# Patient Record
Sex: Female | Born: 1990 | Race: Black or African American | Hispanic: No | Marital: Single | Smoking: Current every day smoker
Health system: Southern US, Community
[De-identification: ages and names within clinical notes are randomized; demographics above are authoritative.]

## PROBLEM LIST (undated history)

## (undated) DIAGNOSIS — K2970 Gastritis, unspecified, without bleeding: Secondary | ICD-10-CM

## (undated) DIAGNOSIS — K219 Gastro-esophageal reflux disease without esophagitis: Secondary | ICD-10-CM

---

## 2015-07-01 ENCOUNTER — Emergency Department: Payer: Medicaid Other

## 2015-07-01 ENCOUNTER — Encounter: Payer: Self-pay | Admitting: Emergency Medicine

## 2015-07-01 ENCOUNTER — Emergency Department
Admission: EM | Admit: 2015-07-01 | Discharge: 2015-07-01 | Disposition: A | Discharge status: Home / Self Care | Payer: Medicaid Other | Attending: Emergency Medicine | Admitting: Emergency Medicine

## 2015-07-01 DIAGNOSIS — R112 Nausea with vomiting, unspecified: Secondary | ICD-10-CM | POA: Diagnosis not present

## 2015-07-01 DIAGNOSIS — R1013 Epigastric pain: Secondary | ICD-10-CM | POA: Insufficient documentation

## 2015-07-01 DIAGNOSIS — F17210 Nicotine dependence, cigarettes, uncomplicated: Secondary | ICD-10-CM | POA: Diagnosis not present

## 2015-07-01 DIAGNOSIS — R1011 Right upper quadrant pain: Secondary | ICD-10-CM | POA: Diagnosis present

## 2015-07-01 DIAGNOSIS — D72829 Elevated white blood cell count, unspecified: Secondary | ICD-10-CM

## 2015-07-01 DIAGNOSIS — K2970 Gastritis, unspecified, without bleeding: Secondary | ICD-10-CM

## 2015-07-01 HISTORY — DX: Gastro-esophageal reflux disease without esophagitis: K21.9

## 2015-07-01 LAB — COMPREHENSIVE METABOLIC PANEL
ALBUMIN: 4.5 g/dL (ref 3.5–5.0)
ALT: 29 U/L (ref 14–54)
AST: 37 U/L (ref 15–41)
Alkaline Phosphatase: 97 U/L (ref 38–126)
Anion gap: 12 (ref 5–15)
BUN: 16 mg/dL (ref 6–20)
CHLORIDE: 97 mmol/L — AB (ref 101–111)
CO2: 23 mmol/L (ref 22–32)
CREATININE: 1.02 mg/dL — AB (ref 0.44–1.00)
Calcium: 9 mg/dL (ref 8.9–10.3)
GFR calc Af Amer: >60 mL/min (ref >60)
GFR calc non Af Amer: >60 mL/min (ref >60)
Glucose, Bld: 101 mg/dL — ABNORMAL HIGH (ref 65–99)
Potassium: 3.4 mmol/L — ABNORMAL LOW (ref 3.5–5.1)
SODIUM: 132 mmol/L — AB (ref 135–145)
Total Bilirubin: 1.2 mg/dL (ref 0.3–1.2)
Total Protein: 9.3 g/dL — ABNORMAL HIGH (ref 6.5–8.1)

## 2015-07-01 LAB — CBC
HEMATOCRIT: 43.9 % (ref 35.0–47.0)
Hemoglobin: 14.9 g/dL (ref 12.0–16.0)
MCH: 32.6 pg (ref 26.0–34.0)
MCHC: 34 g/dL (ref 32.0–36.0)
MCV: 95.6 fL (ref 80.0–100.0)
Platelets: 340 10*3/uL (ref 150–440)
RBC: 4.59 MIL/uL (ref 3.80–5.20)
RDW: 13.3 % (ref 11.5–14.5)
WBC: 20.2 10*3/uL — ABNORMAL HIGH (ref 3.6–11.0)

## 2015-07-01 LAB — POCT PREGNANCY, URINE: PREG TEST UR: NEGATIVE

## 2015-07-01 LAB — LIPASE, BLOOD: LIPASE: 14 U/L (ref 11–51)

## 2015-07-01 LAB — URINALYSIS COMPLETE WITH MICROSCOPIC (ARMC ONLY)
Bilirubin Urine: NEGATIVE
Glucose, UA: NEGATIVE mg/dL
Leukocytes, UA: NEGATIVE
Nitrite: NEGATIVE
PROTEIN: 100 mg/dL — AB
Specific Gravity, Urine: 1.036 — ABNORMAL HIGH (ref 1.005–1.030)
pH: 6 (ref 5.0–8.0)

## 2015-07-01 MED ORDER — SODIUM CHLORIDE 0.9 % IV BOLUS (SEPSIS)
1000.0000 mL | Freq: Once | INTRAVENOUS | Status: AC
  Administered 2015-07-01: 1000 mL via INTRAVENOUS

## 2015-07-01 MED ORDER — ONDANSETRON HCL 4 MG/2ML IJ SOLN
4.0000 mg | Freq: Once | INTRAMUSCULAR | Status: AC
  Administered 2015-07-01: 4 mg via INTRAVENOUS
  Filled 2015-07-01: qty 2

## 2015-07-01 MED ORDER — MORPHINE SULFATE (PF) 4 MG/ML IV SOLN
4.0000 mg | Freq: Once | INTRAVENOUS | Status: AC
  Administered 2015-07-01: 4 mg via INTRAVENOUS
  Filled 2015-07-01: qty 1

## 2015-07-01 MED ORDER — SUCRALFATE 1 G PO TABS: 1.0000 g | ORAL_TABLET | Freq: Four times a day (QID) | ORAL | Status: DC | PRN

## 2015-07-01 MED ORDER — IOHEXOL 240 MG/ML SOLN
25.0000 mL | Freq: Once | INTRAMUSCULAR | Status: AC | PRN
  Administered 2015-07-01: 25 mL via ORAL
  Filled 2015-07-01: qty 25

## 2015-07-01 MED ORDER — GI COCKTAIL ~~LOC~~: 30.0000 mL | Freq: Once | ORAL | Status: DC

## 2015-07-01 MED ORDER — ONDANSETRON HCL 4 MG PO TABS: ORAL_TABLET | ORAL | Status: DC

## 2015-07-01 MED ORDER — IOHEXOL 300 MG/ML  SOLN
125.0000 mL | Freq: Once | INTRAMUSCULAR | Status: AC | PRN
  Administered 2015-07-01: 125 mL via INTRAVENOUS

## 2015-07-01 NOTE — ED Provider Notes (Signed)
[facility] Emergency Department Provider Note  ____________________________________________  Time seen: Approximately 8:33 PM  I have reviewed the triage vital signs and the nursing notes.   HISTORY  Chief Complaint Abdominal Pain    HPI Christine Shields is a 25 y.o. female with past medical history of acid reflux who presents with acute onset of nausea, vomiting, and severe epigastric pain for about 2 days.  She states this started abruptly 2 days ago and has been constant.  She states that it is a severe burning sensation in her epigastrium and she feels like eating makes it worse.  She presents very dramatically, writhing, moaning, groaning, and flipping around in bed.  During her interview she began screaming because she said that her jaw locked up and she was sobbing and crying freely.  She does endorse a history of acid reflux and states that it feels similar.  She denies fever/chills, chest pain, shortness of breath, and any other abdominal pain.  She did have one loose stool.  Nothing makes it better and she thinks that everything makes it worse.   History reviewed. No pertinent past medical history.  There are no active problems to display for this patient.   History reviewed. No pertinent past surgical history.  No current outpatient prescriptions on file.  Allergies Review of patient's allergies indicates no known allergies.  No family history on file.  Social History Social History  Substance Use Topics  . Smoking status: Current Every Day Smoker -- 0.50 packs/day    Types: Cigarettes  . Smokeless tobacco: None  . Alcohol Use: Yes    Review of Systems Constitutional: No fever/chills Eyes: No visual changes. ENT: No sore throat. Cardiovascular: Denies chest pain. Respiratory: Denies shortness of breath. Gastrointestinal: Severe burning epigastric pain with nausea and vomiting and one loose stool Genitourinary: Negative for  dysuria. Musculoskeletal: Negative for back pain. Skin: Negative for rash. Neurological: Negative for headaches, focal weakness or numbness.  10-point ROS otherwise negative.  ____________________________________________   PHYSICAL EXAM:  VITAL SIGNS: ED Triage Vitals  Enc Vitals Group     BP 07/01/15 1913 129/80 mmHg     Pulse Rate 07/01/15 1913 78     Resp 07/01/15 1913 20     Temp 07/01/15 1913 98.5 F (36.9 C)     Temp Source 07/01/15 1913 Oral     SpO2 07/01/15 1913 99 %     Weight 07/01/15 1913 190 lb (86.183 kg)     Height 07/01/15 1913 5\' 3"  (1.6 m)     Head Cir --      Peak Flow --      Pain Score 07/01/15 1914 8     Pain Loc --      Pain Edu? --      Excl. in GC? --     Constitutional: Alert, sobbing, moaning, sometimes yelling/screaming in pain, very dramatic upon arrival in exam room Eyes: Conjunctivae are normal. PERRL. EOMI. Head: Atraumatic. Nose: No congestion/rhinnorhea. Mouth/Throat: Mucous membranes are moist.  Oropharynx non-erythematous. Neck: No stridor.  No meningeal signs.   Cardiovascular: Normal rate, regular rhythm. Good peripheral circulation. Grossly normal heart sounds.   Respiratory: Normal respiratory effort.  No retractions. Lungs CTAB. Gastrointestinal: Soft w/ TTP of RUQ.  Difficult to assess due to lack of cooperation and dramatic presentation Musculoskeletal: No lower extremity tenderness nor edema. No gross deformities of extremities. Neurologic:  Normal speech and language. No gross focal neurologic deficits are appreciated.  Skin:  Skin is warm, dry and intact. No rash noted.   ____________________________________________   LABS (all labs ordered are listed, but only abnormal results are displayed)  Labs Reviewed  COMPREHENSIVE METABOLIC PANEL - Abnormal; Notable for the following:    Sodium 132 (*)    Potassium 3.4 (*)    Chloride 97 (*)    Glucose, Bld 101 (*)    Creatinine, Ser 1.02 (*)    Total Protein 9.3 (*)     All other components within normal limits  CBC - Abnormal; Notable for the following:    WBC 20.2 (*)    All other components within normal limits  LIPASE, BLOOD  URINALYSIS COMPLETEWITH MICROSCOPIC ([facility] ONLY)  POC URINE PREG, ED   ____________________________________________  EKG  None ____________________________________________  RADIOLOGY   No results found.  ____________________________________________   PROCEDURES  Procedure(s) performed: None  Critical Care performed: No ____________________________________________   INITIAL IMPRESSION / ASSESSMENT AND PLAN / ED COURSE  Pertinent labs & imaging results that were available during my care of the patient were reviewed by me and considered in my medical decision making (see chart for details).  Given her reported severe pain and the dramatic presentation, I immediately ordered 4 mg of morphine and 4 mg Zofran which alleviated her pain completely according to the patient's report to the nurse on reassessment.  She has a leukocytosis of 20 which is significant and may represent an intra-abdominal pathology.  Given the location of epigastric pain and the tenderness to palpation of the right upper quadrant abdominal pain and a right upper quadrant ultrasound to look for gallbladder disease.  He has no pelvic or lower abdominal tenderness.  If her ultrasound is negative, I will proceed with a CT scan of the abdomen and pelvis given her leukocytosis and reported pain.  ----------------------------------------- 11:01 PM on 07/01/2015 -----------------------------------------  The patient has no more pain at this time, the one dose of morphine seems completely resolved it.  She is still awaiting a CT scan.  Her urine demonstrated mild ketonuria but no gross evidence of infection.  I am transferring ED care to Dr. Dolores Frame to follow-up the pending CT scan and reassess.  ____________________________________________  FINAL CLINICAL  IMPRESSION(S) / ED DIAGNOSES  Epigastric abdominal pain Nausea and vomiting Leukocytosis    NEW MEDICATIONS STARTED DURING THIS VISIT:  New Prescriptions   No medications on file      Note:  This document was prepared using Dragon voice recognition software and may include unintentional dictation errors.   Loleta Rose, MD 07/02/15 1455

## 2015-07-01 NOTE — ED Notes (Signed)
Pt to triage via w/c with no distress noted, brought in by EMS; pt c/o stomach burning and nausea; st takes nexium for acid reflux daily but does not help

## 2015-07-01 NOTE — ED Notes (Addendum)
Pt presents to ED c/o N/V x 2 days, reports had diarrhea episode on 3/19. Pt reports has not had BM in 2 days, reports has not wanted to eat or drink because she states she is feels eating would make her pain worse. During assessment patient stating "I just want to sleep, it makes the acid go away," Pt moaning and groaning, reporting burning in mid abdomen area. Pt has hx of acid reflux. Pt alert and oriented, skin warm and dry. Denies chest pain or shortness of breath. Pt denies fever.

## 2015-07-01 NOTE — ED Provider Notes (Signed)
-----------------------------------------   11:38 PM on 07/01/2015 -----------------------------------------  CT abdomen and pelvis with contrast interpreted per Dr. Andria MeuseStevens: No acute process demonstrated in the abdomen or pelvis. No evidence of bowel obstruction or inflammation. There is thickening of the wall of the esophagus which may indicate reflux disease.  Patient is resting in no acute distress. Updated her of CT imaging results. Will proceed with plan to discharge home on Carafate and Zofran per Dr. York CeriseForbach. Strict return precautions given. Patient verbalizes understanding and agrees with plan of care.  Irean HongJade J Sung, MD 07/02/15 [phone]

## 2015-07-01 NOTE — Discharge Instructions (Signed)
You have been seen in the Emergency Department (ED) for abdominal pain.  Your evaluation did not identify a clear cause of your symptoms but was generally reassuring. ° °Please follow up as instructed above regarding today’s emergent visit and the symptoms that are bothering you. ° °Return to the ED if your abdominal pain worsens or fails to improve, you develop bloody vomiting, bloody diarrhea, you are unable to tolerate fluids due to vomiting, fever greater than 101, or other symptoms that concern you. ° ° °Gastritis, Adult °Gastritis is soreness and swelling (inflammation) of the lining of the stomach. Gastritis can develop as a sudden onset (acute) or long-term (chronic) condition. If gastritis is not treated, it can lead to stomach bleeding and ulcers. °CAUSES  °Gastritis occurs when the stomach lining is weak or damaged. Digestive juices from the stomach then inflame the weakened stomach lining. The stomach lining may be weak or damaged due to viral or bacterial infections. One common bacterial infection is the Helicobacter pylori infection. Gastritis can also result from excessive alcohol consumption, taking certain medicines, or having too much acid in the stomach.  °SYMPTOMS  °In some cases, there are no symptoms. When symptoms are present, they may include: °· Pain or a burning sensation in the upper abdomen. °· Nausea. °· Vomiting. °· An uncomfortable feeling of fullness after eating. °DIAGNOSIS  °Your caregiver may suspect you have gastritis based on your symptoms and a physical exam. To determine the cause of your gastritis, your caregiver may perform the following: °· Blood or stool tests to check for the H pylori bacterium. °· Gastroscopy. A thin, flexible tube (endoscope) is passed down the esophagus and into the stomach. The endoscope has a light and camera on the end. Your caregiver uses the endoscope to view the inside of the stomach. °· Taking a tissue sample (biopsy) from the stomach to examine  under a microscope. °TREATMENT  °Depending on the cause of your gastritis, medicines may be prescribed. If you have a bacterial infection, such as an H pylori infection, antibiotics may be given. If your gastritis is caused by too much acid in the stomach, H2 blockers or antacids may be given. Your caregiver may recommend that you stop taking aspirin, ibuprofen, or other nonsteroidal anti-inflammatory drugs (NSAIDs). °HOME CARE INSTRUCTIONS °· Only take over-the-counter or prescription medicines as directed by your caregiver. °· If you were given antibiotic medicines, take them as directed. Finish them even if you start to feel better. °· Drink enough fluids to keep your urine clear or pale yellow. °· Avoid foods and drinks that make your symptoms worse, such as: °· Caffeine or alcoholic drinks. °· Chocolate. °· Peppermint or mint flavorings. °· Garlic and onions. °· Spicy foods. °· Citrus fruits, such as oranges, lemons, or limes. °· Tomato-based foods such as sauce, chili, salsa, and pizza. °· Fried and fatty foods. °· Eat small, frequent meals instead of large meals. °SEEK IMMEDIATE MEDICAL CARE IF:  °· You have black or dark red stools. °· You vomit blood or material that looks like coffee grounds. °· You are unable to keep fluids down. °· Your abdominal pain gets worse. °· You have a fever. °· You do not feel better after 1 week. °· You have any other questions or concerns. °MAKE SURE YOU: °· Understand these instructions. °· Will watch your condition. °· Will get help right away if you are not doing well or get worse. °  °This information is not intended to replace advice given to   you by your health care provider. Make sure you discuss any questions you have with your health care provider.   Document Released: 03/22/2001 Document Revised: 09/27/2011 Document Reviewed: 05/11/2011 Elsevier Interactive Patient Education 2016 Elsevier Inc.    Abdominal Pain, Adult Many things can cause abdominal pain.  Usually, abdominal pain is not caused by a disease and will improve without treatment. It can often be observed and treated at home. Your health care provider will do a physical exam and possibly order blood tests and X-rays to help determine the seriousness of your pain. However, in many cases, more time must pass before a clear cause of the pain can be found. Before that point, your health care provider may not know if you need more testing or further treatment. HOME CARE INSTRUCTIONS Monitor your abdominal pain for any changes. The following actions may help to alleviate any discomfort you are experiencing:  Only take over-the-counter or prescription medicines as directed by your health care provider.  Do not take laxatives unless directed to do so by your health care provider.  Try a clear liquid diet (broth, tea, or water) as directed by your health care provider. Slowly move to a bland diet as tolerated. SEEK MEDICAL CARE IF:  You have unexplained abdominal pain.  You have abdominal pain associated with nausea or diarrhea.  You have pain when you urinate or have a bowel movement.  You experience abdominal pain that wakes you in the night.  You have abdominal pain that is worsened or improved by eating food.  You have abdominal pain that is worsened with eating fatty foods.  You have a fever. SEEK IMMEDIATE MEDICAL CARE IF:  Your pain does not go away within 2 hours.  You keep throwing up (vomiting).  Your pain is felt only in portions of the abdomen, such as the right side or the left lower portion of the abdomen.  You pass bloody or black tarry stools. MAKE SURE YOU:  Understand these instructions.  Will watch your condition.  Will get help right away if you are not doing well or get worse.   This information is not intended to replace advice given to you by your health care provider. Make sure you discuss any questions you have with your health care provider.     Document Released: 01/05/2005 Document Revised: 12/17/2014 Document Reviewed: 12/05/2012 Elsevier Interactive Patient Education [employer] Inc[employer]

## 2015-07-03 ENCOUNTER — Emergency Department
Admission: EM | Admit: 2015-07-03 | Discharge: 2015-07-03 | Disposition: A | Discharge status: Home / Self Care | Payer: Medicaid Other | Attending: Emergency Medicine | Admitting: Emergency Medicine

## 2015-07-03 ENCOUNTER — Encounter: Payer: Self-pay | Admitting: *Deleted

## 2015-07-03 DIAGNOSIS — R1013 Epigastric pain: Secondary | ICD-10-CM

## 2015-07-03 DIAGNOSIS — F17210 Nicotine dependence, cigarettes, uncomplicated: Secondary | ICD-10-CM | POA: Insufficient documentation

## 2015-07-03 DIAGNOSIS — K2970 Gastritis, unspecified, without bleeding: Secondary | ICD-10-CM | POA: Diagnosis not present

## 2015-07-03 DIAGNOSIS — R1010 Upper abdominal pain, unspecified: Secondary | ICD-10-CM | POA: Diagnosis present

## 2015-07-03 LAB — COMPREHENSIVE METABOLIC PANEL
ALT: 55 U/L — ABNORMAL HIGH (ref 14–54)
ANION GAP: 17 — AB (ref 5–15)
AST: 60 U/L — AB (ref 15–41)
Albumin: 4.4 g/dL (ref 3.5–5.0)
Alkaline Phosphatase: 93 U/L (ref 38–126)
BUN: 14 mg/dL (ref 6–20)
CHLORIDE: 87 mmol/L — AB (ref 101–111)
CO2: 28 mmol/L (ref 22–32)
Calcium: 9.3 mg/dL (ref 8.9–10.3)
Creatinine, Ser: 1.03 mg/dL — ABNORMAL HIGH (ref 0.44–1.00)
GFR calc Af Amer: >60 mL/min (ref >60)
Glucose, Bld: 86 mg/dL (ref 65–99)
POTASSIUM: 2.7 mmol/L — AB (ref 3.5–5.1)
Sodium: 132 mmol/L — ABNORMAL LOW (ref 135–145)
Total Bilirubin: 1.6 mg/dL — ABNORMAL HIGH (ref 0.3–1.2)
Total Protein: 8.7 g/dL — ABNORMAL HIGH (ref 6.5–8.1)

## 2015-07-03 LAB — CBC
HCT: 44.4 % (ref 35.0–47.0)
Hemoglobin: 15.4 g/dL (ref 12.0–16.0)
MCH: 32.5 pg (ref 26.0–34.0)
MCHC: 34.6 g/dL (ref 32.0–36.0)
MCV: 94 fL (ref 80.0–100.0)
Platelets: 360 10*3/uL (ref 150–440)
RBC: 4.72 MIL/uL (ref 3.80–5.20)
RDW: 13.2 % (ref 11.5–14.5)
WBC: 14.4 10*3/uL — ABNORMAL HIGH (ref 3.6–11.0)

## 2015-07-03 LAB — LIPASE, BLOOD: Lipase: 27 U/L (ref 11–51)

## 2015-07-03 MED ORDER — ONDANSETRON HCL 4 MG/2ML IJ SOLN
4.0000 mg | Freq: Once | INTRAMUSCULAR | Status: AC
  Administered 2015-07-03: 4 mg via INTRAVENOUS
  Filled 2015-07-03: qty 2

## 2015-07-03 MED ORDER — HYDROCODONE-ACETAMINOPHEN 5-325 MG PO TABS: 1.0000 | ORAL_TABLET | ORAL | Status: DC | PRN

## 2015-07-03 MED ORDER — SODIUM CHLORIDE 0.9 % IV BOLUS (SEPSIS)
1000.0000 mL | Freq: Once | INTRAVENOUS | Status: AC
  Administered 2015-07-03: 1000 mL via INTRAVENOUS

## 2015-07-03 MED ORDER — MORPHINE SULFATE (PF) 4 MG/ML IV SOLN
4.0000 mg | Freq: Once | INTRAVENOUS | Status: AC
  Administered 2015-07-03: 4 mg via INTRAVENOUS
  Filled 2015-07-03: qty 1

## 2015-07-03 MED ORDER — GI COCKTAIL ~~LOC~~
30.0000 mL | Freq: Once | ORAL | Status: AC
Start: 2015-07-03 — End: 2015-07-03
  Administered 2015-07-03: 30 mL via ORAL
  Filled 2015-07-03: qty 30

## 2015-07-03 MED ORDER — PANTOPRAZOLE SODIUM 40 MG PO TBEC: 40.0000 mg | DELAYED_RELEASE_TABLET | Freq: Every day | ORAL | Status: DC

## 2015-07-03 NOTE — Discharge Instructions (Signed)
Please take your medications as prescribed. Follow-up with GI medicine by calling the number provided. Return to the emergency department for any worsening pain, or any other symptom personally concerning to yourself.   Gastritis, Adult Gastritis is soreness and puffiness (inflammation) of the lining of the stomach. If you do not get help, gastritis can cause bleeding and sores (ulcers) in the stomach. HOME CARE   Only take medicine as told by your doctor.  If you were given antibiotic medicines, take them as told. Finish the medicines even if you start to feel better.  Drink enough fluids to keep your pee (urine) clear or pale yellow.  Avoid foods and drinks that make your problems worse. Foods you may want to avoid include:  Caffeine or alcohol.  Chocolate.  Mint.  Garlic and onions.  Spicy foods.  Citrus fruits, including oranges, lemons, or limes.  Food containing tomatoes, including sauce, chili, salsa, and pizza.  Fried and fatty foods.  Eat small meals throughout the day instead of large meals. GET HELP RIGHT AWAY IF:   You have black or dark red poop (stools).  You throw up (vomit) blood. It may look like coffee grounds.  You cannot keep fluids down.  Your belly (abdominal) pain gets worse.  You have a fever.  You do not feel better after 1 week.  You have any other questions or concerns. MAKE SURE YOU:   Understand these instructions.  Will watch your condition.  Will get help right away if you are not doing well or get worse.   This information is not intended to replace advice given to you by your health care provider. Make sure you discuss any questions you have with your health care provider.   Document Released: 09/14/2007 Document Revised: 06/20/2011 Document Reviewed: 05/11/2011 Elsevier Interactive Patient Education [employer] Else[employer] Inc[employer]

## 2015-07-03 NOTE — ED Provider Notes (Signed)
[facility] Emergency Department Provider Note  Time seen: 10:05 AM  I have reviewed the triage vital signs and the nursing notes.   HISTORY  Chief Complaint Abdominal Pain    HPI Christine Shields is a 25 y.o. female with a past medical history of gastric reflux who presents the emergency department with upper abdominal pain. According to the patient and her mother for the past 4 days of patient has had upper abdominal pain, nausea and vomiting. Was seen in the emergency department 2 days ago for the same diagnosed with gastritis. At that time had a normal CT scan. Mom states the patient has not sought vomiting since going home, unable to keep down any fluids or food. Patient describes a burning 10/10 pain in the epigastrium along with nausea and vomiting. Patient is actively dry heaving in the emergency department. Denies lower abdominal pain, dysuria, diarrhea. Denies chest pain or shortness of breath.Patient states a history of gastric reflux in the past, mom states she will occasionally be complaining of pain will take Tums or Zantac and feel better but she has never had an episode like this.     Past Medical History  Diagnosis Date  . GERD (gastroesophageal reflux disease)     There are no active problems to display for this patient.   History reviewed. No pertinent past surgical history.  Current Outpatient Rx  Name  Route  Sig  Dispense  Refill  . ondansetron (ZOFRAN) 4 MG tablet      Take 1-2 tabs by mouth every 8 hours as needed for nausea/vomiting   30 tablet   0   . sucralfate (CARAFATE) 1 g tablet   Oral   Take 1 tablet (1 g total) by mouth 4 (four) times daily as needed (for abdominal discomfort, nausea, and/or vomiting).   30 tablet   1     Allergies Review of patient's allergies indicates no known allergies.  No family history on file.  Social History Social History  Substance Use Topics  . Smoking status: Current Every Day  Smoker -- 0.50 packs/day    Types: Cigarettes  . Smokeless tobacco: None  . Alcohol Use: Yes    Review of Systems Constitutional: Negative for fever. Cardiovascular: Negative for chest pain. Respiratory: Negative for shortness of breath. Gastrointestinal: Epigastric burning pain. Positive for nausea and vomiting. Negative for diarrhea. Genitourinary: Negative for dysuria. Musculoskeletal: Negative for back pain. Neurological: Negative for headache 10-point ROS otherwise negative.  ____________________________________________   PHYSICAL EXAM:  VITAL SIGNS: ED Triage Vitals  Enc Vitals Group     BP 07/03/15 0906 121/73 mmHg     Pulse Rate 07/03/15 0906 113     Resp --      Temp 07/03/15 0906 98.8 F (37.1 C)     Temp Source 07/03/15 0906 Oral     SpO2 07/03/15 0906 98 %     Weight 07/03/15 0906 153 lb (69.4 kg)     Height 07/03/15 0906 5\' 3"  (1.6 m)     Head Cir --      Peak Flow --      Pain Score 07/03/15 0906 8     Pain Loc --      Pain Edu? --      Excl. in GC? --     Constitutional: Alert and oriented. Moderate distress due to epigastric pain nausea and vomiting. Moaning in bed. Eyes: Normal exam ENT   Head: Normocephalic and atraumatic.   Mouth/Throat:  Mucous membranes are moist. Cardiovascular: Normal rate, regular rhythm. No murmurs, rubs, or gallops. Respiratory: Normal respiratory effort without tachypnea nor retractions. Breath sounds are clear Gastrointestinal: Soft, moderate epigastric tenderness palpation. No rebound or guarding. No distention. Musculoskeletal: Nontender with normal range of motion in all extremities. Neurologic:  Normal speech and language. No gross focal neurologic deficits  Skin:  Skin is warm, dry and intact.  Psychiatric: Mood and affect are normal.     INITIAL IMPRESSION / ASSESSMENT AND PLAN / ED COURSE  Pertinent labs & imaging results that were available during my care of the patient were reviewed by me and  considered in my medical decision making (see chart for details).  Patient presents with upper abdominal pain, nausea, vomiting. Describes the pain as a burning sensation 10/10 in the epigastrium. Her exam is very consistent with gastritis. CT scan largely negative besides signs of chronic gastric reflux. Labs showed an elevated white blood cell count but were otherwise within normal limits. We will repeat labs, treat pain, nausea, IV hydrate, and closely monitor in the emergency department. Patient does have moderate epigastric tenderness, no rebound or guarding or signs of peritonitis.  Labs show a white blood cell count of 14, slightly improved from yesterday. Otherwise nonspecific findings of mild LFT elevation. Patient had a recent normal CT and ultrasound. Patient states she is feeling better, she was able to eat. States she feels some nausea but denies any pain. I offered admission given her continued nausea, patient much prefers to go home. We'll discharge him Protonix as well as a short course of pain medication. Patient is follow up with GI medicine. I discussed very strict return precautions with the patient.   ____________________________________________   FINAL CLINICAL IMPRESSION(S) / ED DIAGNOSES  Gastritis   Minna Antis, MD 07/03/15 [phone]

## 2015-07-03 NOTE — ED Notes (Signed)
Pt was seen 3/20 for gastritis, pt returns today for burning in her stomach with nausea and vomiting

## 2015-07-03 NOTE — ED Notes (Signed)
Pt's mother states N/V hasn't stopped since she was here 3/22. Pt can't keep fluids/food down.

## 2015-07-24 ENCOUNTER — Encounter: Payer: Self-pay | Admitting: Urgent Care

## 2015-07-24 ENCOUNTER — Emergency Department
Admission: EM | Admit: 2015-07-24 | Discharge: 2015-07-24 | Disposition: A | Discharge status: Home / Self Care | Payer: Medicaid Other | Attending: Emergency Medicine | Admitting: Emergency Medicine

## 2015-07-24 DIAGNOSIS — F17210 Nicotine dependence, cigarettes, uncomplicated: Secondary | ICD-10-CM | POA: Diagnosis not present

## 2015-07-24 DIAGNOSIS — R1013 Epigastric pain: Secondary | ICD-10-CM

## 2015-07-24 DIAGNOSIS — K219 Gastro-esophageal reflux disease without esophagitis: Secondary | ICD-10-CM | POA: Insufficient documentation

## 2015-07-24 DIAGNOSIS — K920 Hematemesis: Secondary | ICD-10-CM | POA: Diagnosis present

## 2015-07-24 DIAGNOSIS — R112 Nausea with vomiting, unspecified: Secondary | ICD-10-CM

## 2015-07-24 DIAGNOSIS — K2970 Gastritis, unspecified, without bleeding: Secondary | ICD-10-CM

## 2015-07-24 LAB — CBC
HCT: 44.6 % (ref 35.0–47.0)
Hemoglobin: 15.2 g/dL (ref 12.0–16.0)
MCH: 32.2 pg (ref 26.0–34.0)
MCHC: 34.2 g/dL (ref 32.0–36.0)
MCV: 94.2 fL (ref 80.0–100.0)
Platelets: 354 10*3/uL (ref 150–440)
RBC: 4.74 MIL/uL (ref 3.80–5.20)
RDW: 13.3 % (ref 11.5–14.5)
WBC: 16.5 10*3/uL — ABNORMAL HIGH (ref 3.6–11.0)

## 2015-07-24 LAB — COMPREHENSIVE METABOLIC PANEL
ALK PHOS: 96 U/L (ref 38–126)
ALT: 28 U/L (ref 14–54)
AST: 23 U/L (ref 15–41)
Albumin: 4.4 g/dL (ref 3.5–5.0)
Anion gap: 14 (ref 5–15)
BUN: 14 mg/dL (ref 6–20)
CALCIUM: 9.4 mg/dL (ref 8.9–10.3)
CO2: 26 mmol/L (ref 22–32)
CREATININE: 0.98 mg/dL (ref 0.44–1.00)
Chloride: 94 mmol/L — ABNORMAL LOW (ref 101–111)
GFR calc non Af Amer: >60 mL/min (ref >60)
GLUCOSE: 111 mg/dL — AB (ref 65–99)
Potassium: 2.9 mmol/L — CL (ref 3.5–5.1)
SODIUM: 134 mmol/L — AB (ref 135–145)
Total Bilirubin: 1.1 mg/dL (ref 0.3–1.2)
Total Protein: 9.5 g/dL — ABNORMAL HIGH (ref 6.5–8.1)

## 2015-07-24 LAB — TYPE AND SCREEN
ABO/RH(D): O POS
ANTIBODY SCREEN: NEGATIVE

## 2015-07-24 LAB — ABO/RH: ABO/RH(D): O POS

## 2015-07-24 MED ORDER — MORPHINE SULFATE (PF) 4 MG/ML IV SOLN
INTRAVENOUS | Status: AC
  Administered 2015-07-24: 4 mg via INTRAVENOUS
  Filled 2015-07-24: qty 1

## 2015-07-24 MED ORDER — ONDANSETRON HCL 4 MG/2ML IJ SOLN
4.0000 mg | Freq: Once | INTRAMUSCULAR | Status: AC
  Administered 2015-07-24: 4 mg via INTRAVENOUS

## 2015-07-24 MED ORDER — PROMETHAZINE HCL 25 MG PO TABS: 25.0000 mg | ORAL_TABLET | Freq: Four times a day (QID) | ORAL | Status: DC | PRN

## 2015-07-24 MED ORDER — ONDANSETRON HCL 4 MG/2ML IJ SOLN
INTRAMUSCULAR | Status: AC
  Administered 2015-07-24: 4 mg via INTRAVENOUS
  Filled 2015-07-24: qty 2

## 2015-07-24 MED ORDER — MORPHINE SULFATE (PF) 4 MG/ML IV SOLN
4.0000 mg | Freq: Once | INTRAVENOUS | Status: AC
  Administered 2015-07-24: 4 mg via INTRAVENOUS

## 2015-07-24 MED ORDER — SODIUM CHLORIDE 0.9 % IV BOLUS (SEPSIS)
1000.0000 mL | Freq: Once | INTRAVENOUS | Status: AC
  Administered 2015-07-24: 1000 mL via INTRAVENOUS

## 2015-07-24 NOTE — ED Provider Notes (Signed)
[facility] Emergency Department Provider Note  Time seen: 4:00 AM  I have reviewed the triage vital signs and the nursing notes.   HISTORY  Chief Complaint Hematemesis    HPI Christine Shields is a 25 y.o. female the past medical history of gastric reflux and gastritis who presents the emergency department up her abdominal pain and nausea and vomiting for the past 2 days.According to the patient she has a history of gastritis. She states intermittent epigastric pain for the past several weeks, was seen in the emergency department last month for similar event. She states Tuesday evening she had one beer shortly after she became nauseated with upper abdominal pain and vomiting. States she has had nausea and vomiting intermittently ever since. States initially on Tuesday evening after vomiting she had a brief syncopal versus near syncopal episode, but denies any since. Denies any chest pain or trouble breathing. Denies any lower abdominal pain, dysuria or diarrhea. Denies fever. Describes her upper abdominal pain as moderate, burning sensation to the epigastrium.     Past Medical History  Diagnosis Date  . GERD (gastroesophageal reflux disease)     There are no active problems to display for this patient.   History reviewed. No pertinent past surgical history.  Current Outpatient Rx  Name  Route  Sig  Dispense  Refill  . esomeprazole (NEXIUM) 40 MG capsule   Oral   Take 40 mg by mouth daily at 12 noon.         Marland Kitchen. HYDROcodone-acetaminophen (NORCO/VICODIN) 5-325 MG tablet   Oral   Take 1 tablet by mouth every 4 (four) hours as needed for moderate pain.   20 tablet   0   . ondansetron (ZOFRAN) 4 MG tablet      Take 1-2 tabs by mouth every 8 hours as needed for nausea/vomiting   30 tablet   0   . pantoprazole (PROTONIX) 40 MG tablet   Oral   Take 1 tablet (40 mg total) by mouth daily.   30 tablet   1   . sucralfate (CARAFATE) 1 g tablet   Oral    Take 1 tablet (1 g total) by mouth 4 (four) times daily as needed (for abdominal discomfort, nausea, and/or vomiting).   30 tablet   1     Allergies Review of patient's allergies indicates no known allergies.  No family history on file.  Social History Social History  Substance Use Topics  . Smoking status: Current Every Day Smoker -- 0.50 packs/day    Types: Cigarettes  . Smokeless tobacco: None  . Alcohol Use: Yes    Review of Systems Constitutional: Negative for fever. Cardiovascular: Negative for chest pain. Respiratory: Negative for shortness of breath. Gastrointestinal: Epigastric pain. Positive for nausea and vomiting. Negative diarrhea. Genitourinary: Negative for dysuria. Neurological: Negative for headache 10-point ROS otherwise negative.  ____________________________________________   PHYSICAL EXAM:  VITAL SIGNS: ED Triage Vitals  Enc Vitals Group     BP 07/24/15 0209 111/84 mmHg     Pulse Rate 07/24/15 0209 106     Resp 07/24/15 0209 24     Temp 07/24/15 0209 98.4 F (36.9 C)     Temp Source 07/24/15 0209 Oral     SpO2 07/24/15 0209 99 %     Weight 07/24/15 0209 153 lb (69.4 kg)     Height --      Head Cir --      Peak Flow --  Pain Score 07/24/15 0210 10     Pain Loc --      Pain Edu? --      Excl. in GC? --     Constitutional: Alert and oriented. Well appearing and in no distress. Eyes: Normal exam ENT   Head: Normocephalic and atraumatic   Mouth/Throat: Mucous membranes are moist. Cardiovascular: Normal rate, regular rhythm. No murmur Respiratory: Normal respiratory effort without tachypnea nor retractions. Breath sounds are clear Gastrointestinal: Soft, mild epigastric tenderness palpation. No rebound or guarding. No distention. Musculoskeletal: Nontender with normal range of motion in all extremities.  Neurologic:  Normal speech and language. No gross focal neurologic deficits  Skin:  Skin is warm, dry and intact.   Psychiatric: Mood and affect are normal.   ____________________________________________    INITIAL IMPRESSION / ASSESSMENT AND PLAN / ED COURSE  Pertinent labs & imaging results that were available during my care of the patient were reviewed by me and considered in my medical decision making (see chart for details).  Patient presents to the emergency department with epigastric pain/burning sensation along with nausea and vomiting since drinking alcohol 2 days ago. Patient has a history of gastritis with similar presentations in the past. Patient states the abdominal pain is much improved but she continues to feel nauseated. Patient had several episodes of blood streaking in her vomit tonight as well. We will treat the patient's pain and nausea, IV hydrate, and closely monitor in the emergency department. Patient's labs have resulted largely within normal limits, besides mild leukocytosis and hypokalemia, likely associated with the nausea and vomiting.  Patient feeling better after medications. We'll discharge home once IV bolus finishes.  ____________________________________________   FINAL CLINICAL IMPRESSION(S) / ED DIAGNOSES  Gastritis Nausea and vomiting   Minna Antis, MD 07/24/15 0501

## 2015-07-24 NOTE — ED Notes (Signed)
Patient presents with c/o hematemesis that began yesterday. Of note, patient was here on 3/22 for the same and Dx'd with gastritis. Patient reports that she went out drinking on Tuesday and "passed out" then.

## 2015-07-24 NOTE — Discharge Instructions (Signed)
Abdominal Pain, Adult Many things can cause belly (abdominal) pain. Most times, the belly pain is not dangerous. Many cases of belly pain can be watched and treated at home. HOME CARE   Do not take medicines that help you go poop (laxatives) unless told to by your doctor.  Only take medicine as told by your doctor.  Eat or drink as told by your doctor. Your doctor will tell you if you should be on a special diet. GET HELP IF:  You do not know what is causing your belly pain.  You have belly pain while you are sick to your stomach (nauseous) or have runny poop (diarrhea).  You have pain while you pee or poop.  Your belly pain wakes you up at night.  You have belly pain that gets worse or better when you eat.  You have belly pain that gets worse when you eat fatty foods.  You have a fever. GET HELP RIGHT AWAY IF:   The pain does not go away within 2 hours.  You keep throwing up (vomiting).  The pain changes and is only in the right or left part of the belly.  You have bloody or tarry looking poop. MAKE SURE YOU:   Understand these instructions.  Will watch your condition.  Will get help right away if you are not doing well or get worse.   This information is not intended to replace advice given to you by your health care provider. Make sure you discuss any questions you have with your health care provider.   Document Released: 09/14/2007 Document Revised: 04/18/2014 Document Reviewed: 12/05/2012 Elsevier Interactive Patient Education 2016 Elsevier Inc.  Gastritis, Adult Gastritis is soreness and swelling (inflammation) of the lining of the stomach. Gastritis can develop as a sudden onset (acute) or long-term (chronic) condition. If gastritis is not treated, it can lead to stomach bleeding and ulcers. CAUSES  Gastritis occurs when the stomach lining is weak or damaged. Digestive juices from the stomach then inflame the weakened stomach lining. The stomach lining may be  weak or damaged due to viral or bacterial infections. One common bacterial infection is the Helicobacter pylori infection. Gastritis can also result from excessive alcohol consumption, taking certain medicines, or having too much acid in the stomach.  SYMPTOMS  In some cases, there are no symptoms. When symptoms are present, they may include:  Pain or a burning sensation in the upper abdomen.  Nausea.  Vomiting.  An uncomfortable feeling of fullness after eating. DIAGNOSIS  Your caregiver may suspect you have gastritis based on your symptoms and a physical exam. To determine the cause of your gastritis, your caregiver may perform the following:  Blood or stool tests to check for the H pylori bacterium.  Gastroscopy. A thin, flexible tube (endoscope) is passed down the esophagus and into the stomach. The endoscope has a light and camera on the end. Your caregiver uses the endoscope to view the inside of the stomach.  Taking a tissue sample (biopsy) from the stomach to examine under a microscope. TREATMENT  Depending on the cause of your gastritis, medicines may be prescribed. If you have a bacterial infection, such as an H pylori infection, antibiotics may be given. If your gastritis is caused by too much acid in the stomach, H2 blockers or antacids may be given. Your caregiver may recommend that you stop taking aspirin, ibuprofen, or other nonsteroidal anti-inflammatory drugs (NSAIDs). HOME CARE INSTRUCTIONS  Only take over-the-counter or prescription medicines as directed  by your caregiver. °· If you were given antibiotic medicines, take them as directed. Finish them even if you start to feel better. °· Drink enough fluids to keep your urine clear or pale yellow. °· Avoid foods and drinks that make your symptoms worse, such as: °¨ Caffeine or alcoholic drinks. °¨ Chocolate. °¨ Peppermint or mint flavorings. °¨ Garlic and onions. °¨ Spicy foods. °¨ Citrus fruits, such as oranges, lemons, or  limes. °¨ Tomato-based foods such as sauce, chili, salsa, and pizza. °¨ Fried and fatty foods. °· Eat small, frequent meals instead of large meals. °SEEK IMMEDIATE MEDICAL CARE IF:  °· You have black or dark red stools. °· You vomit blood or material that looks like coffee grounds. °· You are unable to keep fluids down. °· Your abdominal pain gets worse. °· You have a fever. °· You do not feel better after 1 week. °· You have any other questions or concerns. °MAKE SURE YOU: °· Understand these instructions. °· Will watch your condition. °· Will get help right away if you are not doing well or get worse. °  °This information is not intended to replace advice given to you by your health care provider. Make sure you discuss any questions you have with your health care provider. °  °Document Released: 03/22/2001 Document Revised: 09/27/2011 Document Reviewed: 05/11/2011 °Elsevier Interactive Patient Education ©2016 Elsevier Inc. ° °

## 2015-07-28 ENCOUNTER — Emergency Department
Admission: EM | Admit: 2015-07-28 | Discharge: 2015-07-28 | Disposition: A | Discharge status: Home / Self Care | Payer: Medicaid Other | Attending: Emergency Medicine | Admitting: Emergency Medicine

## 2015-07-28 ENCOUNTER — Emergency Department: Payer: Medicaid Other

## 2015-07-28 DIAGNOSIS — K2970 Gastritis, unspecified, without bleeding: Secondary | ICD-10-CM | POA: Insufficient documentation

## 2015-07-28 DIAGNOSIS — F17210 Nicotine dependence, cigarettes, uncomplicated: Secondary | ICD-10-CM | POA: Diagnosis not present

## 2015-07-28 DIAGNOSIS — E876 Hypokalemia: Secondary | ICD-10-CM | POA: Diagnosis not present

## 2015-07-28 DIAGNOSIS — E871 Hypo-osmolality and hyponatremia: Secondary | ICD-10-CM | POA: Insufficient documentation

## 2015-07-28 DIAGNOSIS — R1110 Vomiting, unspecified: Secondary | ICD-10-CM | POA: Diagnosis present

## 2015-07-28 DIAGNOSIS — R112 Nausea with vomiting, unspecified: Secondary | ICD-10-CM

## 2015-07-28 LAB — COMPREHENSIVE METABOLIC PANEL
ALK PHOS: 78 U/L (ref 38–126)
ALT: 19 U/L (ref 14–54)
AST: 21 U/L (ref 15–41)
Albumin: 4.2 g/dL (ref 3.5–5.0)
Anion gap: 19 — ABNORMAL HIGH (ref 5–15)
BUN: 12 mg/dL (ref 6–20)
CALCIUM: 9.3 mg/dL (ref 8.9–10.3)
CO2: 27 mmol/L (ref 22–32)
CREATININE: 0.89 mg/dL (ref 0.44–1.00)
Chloride: 82 mmol/L — ABNORMAL LOW (ref 101–111)
GFR calc non Af Amer: >60 mL/min (ref >60)
Glucose, Bld: 85 mg/dL (ref 65–99)
Potassium: 2.5 mmol/L — CL (ref 3.5–5.1)
SODIUM: 128 mmol/L — AB (ref 135–145)
TOTAL PROTEIN: 8.7 g/dL — AB (ref 6.5–8.1)
Total Bilirubin: 1.4 mg/dL — ABNORMAL HIGH (ref 0.3–1.2)

## 2015-07-28 LAB — LIPASE, BLOOD: Lipase: 26 U/L (ref 11–51)

## 2015-07-28 LAB — BASIC METABOLIC PANEL
ANION GAP: 11 (ref 5–15)
BUN: 11 mg/dL (ref 6–20)
CALCIUM: 8 mg/dL — AB (ref 8.9–10.3)
CO2: 30 mmol/L (ref 22–32)
Chloride: 91 mmol/L — ABNORMAL LOW (ref 101–111)
Creatinine, Ser: 0.96 mg/dL (ref 0.44–1.00)
GFR calc Af Amer: >60 mL/min (ref >60)
GLUCOSE: 101 mg/dL — AB (ref 65–99)
Potassium: 2.8 mmol/L — CL (ref 3.5–5.1)
SODIUM: 132 mmol/L — AB (ref 135–145)

## 2015-07-28 LAB — CBC
HCT: 44.3 % (ref 35.0–47.0)
Hemoglobin: 15.3 g/dL (ref 12.0–16.0)
MCH: 32.4 pg (ref 26.0–34.0)
MCHC: 34.5 g/dL (ref 32.0–36.0)
MCV: 93.9 fL (ref 80.0–100.0)
PLATELETS: 326 10*3/uL (ref 150–440)
RBC: 4.72 MIL/uL (ref 3.80–5.20)
RDW: 13 % (ref 11.5–14.5)
WBC: 17.7 10*3/uL — ABNORMAL HIGH (ref 3.6–11.0)

## 2015-07-28 LAB — MAGNESIUM: MAGNESIUM: 2.2 mg/dL (ref 1.7–2.4)

## 2015-07-28 MED ORDER — SODIUM CHLORIDE 0.9 % IV BOLUS (SEPSIS)
1000.0000 mL | Freq: Once | INTRAVENOUS | Status: AC
Start: 2015-07-28 — End: 2015-07-28
  Administered 2015-07-28: 1000 mL via INTRAVENOUS

## 2015-07-28 MED ORDER — POTASSIUM CHLORIDE ER 10 MEQ PO TBCR: 10.0000 meq | EXTENDED_RELEASE_TABLET | Freq: Two times a day (BID) | ORAL | Status: DC

## 2015-07-28 MED ORDER — GI COCKTAIL ~~LOC~~
30.0000 mL | Freq: Once | ORAL | Status: AC
  Administered 2015-07-28: 30 mL via ORAL

## 2015-07-28 MED ORDER — POTASSIUM CHLORIDE CRYS ER 20 MEQ PO TBCR
40.0000 meq | EXTENDED_RELEASE_TABLET | Freq: Once | ORAL | Status: AC
  Administered 2015-07-28: 40 meq via ORAL
  Filled 2015-07-28: qty 2

## 2015-07-28 MED ORDER — GI COCKTAIL ~~LOC~~: 30.0000 mL | Freq: Two times a day (BID) | ORAL | Status: DC | PRN

## 2015-07-28 MED ORDER — POTASSIUM CHLORIDE IN NACL 20-0.9 MEQ/L-% IV SOLN
Freq: Once | INTRAVENOUS | Status: AC
  Administered 2015-07-28: 19:00:00 via INTRAVENOUS
  Filled 2015-07-28: qty 1000

## 2015-07-28 MED ORDER — GI COCKTAIL ~~LOC~~
ORAL | Status: AC
  Administered 2015-07-28: 30 mL via ORAL
  Filled 2015-07-28: qty 30

## 2015-07-28 NOTE — ED Notes (Signed)
Pt c/o epigastric pain for the past 8 days with N/V/D.Marland Kitchen.states she has been seen for the same sx recently

## 2015-07-28 NOTE — Discharge Instructions (Signed)
Please follow-up with GI medicine as scheduled in 2 days. Please use her medications as prescribed, as needed. Please drink plenty of fluids over the next several days. Return to the emergency department for any further significant vomiting episodes, increased abdominal pain, or any other symptoms concerning to yourself.   Gastritis, Adult Gastritis is soreness and swelling (inflammation) of the lining of the stomach. Gastritis can develop as a sudden onset (acute) or long-term (chronic) condition. If gastritis is not treated, it can lead to stomach bleeding and ulcers. CAUSES  Gastritis occurs when the stomach lining is weak or damaged. Digestive juices from the stomach then inflame the weakened stomach lining. The stomach lining may be weak or damaged due to viral or bacterial infections. One common bacterial infection is the Helicobacter pylori infection. Gastritis can also result from excessive alcohol consumption, taking certain medicines, or having too much acid in the stomach.  SYMPTOMS  In some cases, there are no symptoms. When symptoms are present, they may include:  Pain or a burning sensation in the upper abdomen.  Nausea.  Vomiting.  An uncomfortable feeling of fullness after eating. DIAGNOSIS  Your caregiver may suspect you have gastritis based on your symptoms and a physical exam. To determine the cause of your gastritis, your caregiver may perform the following:  Blood or stool tests to check for the H pylori bacterium.  Gastroscopy. A thin, flexible tube (endoscope) is passed down the esophagus and into the stomach. The endoscope has a light and camera on the end. Your caregiver uses the endoscope to view the inside of the stomach.  Taking a tissue sample (biopsy) from the stomach to examine under a microscope. TREATMENT  Depending on the cause of your gastritis, medicines may be prescribed. If you have a bacterial infection, such as an H pylori infection, antibiotics may  be given. If your gastritis is caused by too much acid in the stomach, H2 blockers or antacids may be given. Your caregiver may recommend that you stop taking aspirin, ibuprofen, or other nonsteroidal anti-inflammatory drugs (NSAIDs). HOME CARE INSTRUCTIONS  Only take over-the-counter or prescription medicines as directed by your caregiver.  If you were given antibiotic medicines, take them as directed. Finish them even if you start to feel better.  Drink enough fluids to keep your urine clear or pale yellow.  Avoid foods and drinks that make your symptoms worse, such as:  Caffeine or alcoholic drinks.  Chocolate.  Peppermint or mint flavorings.  Garlic and onions.  Spicy foods.  Citrus fruits, such as oranges, lemons, or limes.  Tomato-based foods such as sauce, chili, salsa, and pizza.  Fried and fatty foods.  Eat small, frequent meals instead of large meals. SEEK IMMEDIATE MEDICAL CARE IF:   You have black or dark red stools.  You vomit blood or material that looks like coffee grounds.  You are unable to keep fluids down.  Your abdominal pain gets worse.  You have a fever.  You do not feel better after 1 week.  You have any other questions or concerns. MAKE SURE YOU:  Understand these instructions.  Will watch your condition.  Will get help right away if you are not doing well or get worse.   This information is not intended to replace advice given to you by your health care provider. Make sure you discuss any questions you have with your health care provider.   Document Released: 03/22/2001 Document Revised: 09/27/2011 Document Reviewed: 05/11/2011 Elsevier Interactive Patient Education 2016 Elsevier  Inc.  Nausea and Vomiting Nausea means you feel sick to your stomach. Throwing up (vomiting) is a reflex where stomach contents come out of your mouth. HOME CARE   Take medicine as told by your doctor.  Do not force yourself to eat. However, you do need  to drink fluids.  If you feel like eating, eat a normal diet as told by your doctor.  Eat rice, wheat, potatoes, bread, lean meats, yogurt, fruits, and vegetables.  Avoid high-fat foods.  Drink enough fluids to keep your pee (urine) clear or pale yellow.  Ask your doctor how to replace body fluid losses (rehydrate). Signs of body fluid loss (dehydration) include:  Feeling very thirsty.  Dry lips and mouth.  Feeling dizzy.  Dark pee.  Peeing less than normal.  Feeling confused.  Fast breathing or heart rate. GET HELP RIGHT AWAY IF:   You have blood in your throw up.  You have black or bloody poop (stool).  You have a bad headache or stiff neck.  You feel confused.  You have bad belly (abdominal) pain.  You have chest pain or trouble breathing.  You do not pee at least once every 8 hours.  You have cold, clammy skin.  You keep throwing up after 24 to 48 hours.  You have a fever. MAKE SURE YOU:   Understand these instructions.  Will watch your condition.  Will get help right away if you are not doing well or get worse.   This information is not intended to replace advice given to you by your health care provider. Make sure you discuss any questions you have with your health care provider.   Document Released: 09/14/2007 Document Revised: 06/20/2011 Document Reviewed: 08/27/2010 Elsevier Interactive Patient Education [employer]2016 Elsevier Inc.

## 2015-07-28 NOTE — ED Provider Notes (Signed)
[facility] Emergency Department Provider Note  Time seen: 6:44 PM  I have reviewed the triage vital signs and the nursing notes.   HISTORY  Chief Complaint Emesis and Diarrhea    HPI Christine Shields is a 25 y.o. female with a past medical history of gastric reflux presents the emergency department nausea, vomiting, epigastric pain.According to the patient for the past 3 weeks she has been having significant nausea, vomiting, epigastric pain which she describes as burning. She states for the past one week she has not been able to tolerate liquids or food due to the abdominal discomfort nausea or vomiting. This is the patient's third visit in the past 3 weeks for the same symptom. Patient has not yet followed up with GI medicine. Patient states the vomiting was extremely severe this morning so she came to the emergency department once again for evaluation. Describes her epigastric pain currently as moderate 6/10, status post GI cocktail brought it down from a 10/10. Burning in quality.     Past Medical History  Diagnosis Date  . GERD (gastroesophageal reflux disease)     There are no active problems to display for this patient.   History reviewed. No pertinent past surgical history.  Current Outpatient Rx  Name  Route  Sig  Dispense  Refill  . esomeprazole (NEXIUM) 40 MG capsule   Oral   Take 40 mg by mouth daily at 12 noon.         Marland Kitchen HYDROcodone-acetaminophen (NORCO/VICODIN) 5-325 MG tablet   Oral   Take 1 tablet by mouth every 4 (four) hours as needed for moderate pain.   20 tablet   0   . ondansetron (ZOFRAN) 4 MG tablet      Take 1-2 tabs by mouth every 8 hours as needed for nausea/vomiting   30 tablet   0   . pantoprazole (PROTONIX) 40 MG tablet   Oral   Take 1 tablet (40 mg total) by mouth daily.   30 tablet   1   . promethazine (PHENERGAN) 25 MG tablet   Oral   Take 1 tablet (25 mg total) by mouth every 6 (six) hours as needed for  nausea or vomiting.   20 tablet   0   . sucralfate (CARAFATE) 1 g tablet   Oral   Take 1 tablet (1 g total) by mouth 4 (four) times daily as needed (for abdominal discomfort, nausea, and/or vomiting).   30 tablet   1     Allergies Review of patient's allergies indicates no known allergies.  No family history on file.  Social History Social History  Substance Use Topics  . Smoking status: Current Every Day Smoker -- 0.50 packs/day    Types: Cigarettes  . Smokeless tobacco: None  . Alcohol Use: Yes    Review of Systems Constitutional: Negative for fever. Cardiovascular: Negative for chest pain. Respiratory: Negative for shortness of breath. Gastrointestinal: Moderate epigastric burning. Positive for nausea and vomiting. Negative diarrhea. Musculoskeletal: Negative for back pain. Skin: Negative for rash. Neurological: Negative for headache 10-point ROS otherwise negative.  ____________________________________________   PHYSICAL EXAM:  VITAL SIGNS: ED Triage Vitals  Enc Vitals Group     BP 07/28/15 1630 133/80 mmHg     Pulse Rate 07/28/15 1630 97     Resp 07/28/15 1630 20     Temp 07/28/15 1632 97.6 F (36.4 C)     Temp Source 07/28/15 1630 Oral     SpO2 07/28/15 1630  100 %     Weight 07/28/15 1630 180 lb (81.647 kg)     Height 07/28/15 1630 5\' 4"  (1.626 m)     Head Cir --      Peak Flow --      Pain Score --      Pain Loc --      Pain Edu? --      Excl. in GC? --     Constitutional: Alert and oriented. Well appearing and in no distress. Eyes: Normal exam ENT   Head: Normocephalic and atraumatic.   Mouth/Throat: Mucous membranes are moist. Cardiovascular: Normal rate, regular rhythm. No murmur. Respiratory: Normal respiratory effort without tachypnea nor retractions. Breath sounds are clear  Gastrointestinal: Soft, mild epigastric tenderness palpation. No rebound or guarding. No distention Musculoskeletal: Nontender with normal range of motion in  all extremities.  Neurologic:  Normal speech and language. No gross focal neurologic deficits Skin:  Skin is warm, dry and intact.  Psychiatric: Mood and affect are normal. Speech and behavior are normal.   ____________________________________________    EKG  EKG reviewed and interpreted by myself shows sinus tachycardia 101 bpm, narrow QRS, normal axis, normal intervals. Nonspecific ST changes. No ST elevations.  ____________________________________________    RADIOLOGY  CT head shows no acute abnormality  ____________________________________________    INITIAL IMPRESSION / ASSESSMENT AND PLAN / ED COURSE  Pertinent labs & imaging results that were available during my care of the patient were reviewed by me and considered in my medical decision making (see chart for details).  Patient presents the emergency department with continued epigastric pain, nausea and vomiting. Patient's symptoms are very suggestive of gastritis. Patient is hyponatremic, hypokalemic, with a significant leukocytosis of 17,000. Her potassium continues to trend down over her 3 visits, sodium trends down over her 3 visits, and her white count trends up over the last 3 visits. Patient has an anion gap of 19 consistent with significant dehydration. We will IV hydrate, replete potassium, and closely monitor in the emergency department.  Patient states 1 week ago she had a syncopal episode in which she hit the left side of her head, since she continues to have headaches intermittently, states a mild headache currently. We'll proceed with a CT scan to rule out intracranial abnormalities.  Patient states she is feeling much better. She was able to eat in the emergency department a meal tray. Patient has received 1-1/2 L of fluid, as well as potassium supplementation. Patient's potassium is increased at 2.8, her sodium is increased to 132 which is much closer to the patient's baseline. Patient's anion gap is decreased  from 19-11. Patient has a follow-up appointment with Dr. Lynnae Prudeobert Elliott on 07/30/15. I discussed these results with the patient, the patient does wish to go home. We'll discharge the patient home, which GI follow-up in 2 days. Patient is agreeable discussed strict return precautions with the patient. We will place the patient on potassium supplementation. ____________________________________________   FINAL CLINICAL IMPRESSION(S) / ED DIAGNOSES  Gastritis Nausea and vomiting Hypokalemia Hyponatremia   Minna AntisKevin Harper Vandervoort, MD 07/28/15 2242

## 2015-07-31 ENCOUNTER — Encounter: Payer: Self-pay | Admitting: *Deleted

## 2015-07-31 ENCOUNTER — Encounter
Admission: RE | Disposition: A | Discharge status: Home / Self Care | Payer: Self-pay | Source: Ambulatory Visit | Attending: Gastroenterology

## 2015-07-31 ENCOUNTER — Ambulatory Visit: Payer: Medicaid Other | Admitting: Anesthesiology

## 2015-07-31 ENCOUNTER — Ambulatory Visit
Admission: RE | Admit: 2015-07-31 | Discharge: 2015-07-31 | Disposition: A | Discharge status: Home / Self Care | Payer: Medicaid Other | Source: Ambulatory Visit | Attending: Gastroenterology | Admitting: Gastroenterology

## 2015-07-31 ENCOUNTER — Ambulatory Visit: Admit: 2015-07-31 | Payer: Self-pay | Admitting: Gastroenterology

## 2015-07-31 DIAGNOSIS — K2210 Ulcer of esophagus without bleeding: Secondary | ICD-10-CM | POA: Diagnosis not present

## 2015-07-31 DIAGNOSIS — K209 Esophagitis, unspecified: Secondary | ICD-10-CM | POA: Diagnosis not present

## 2015-07-31 DIAGNOSIS — K210 Gastro-esophageal reflux disease with esophagitis: Secondary | ICD-10-CM | POA: Insufficient documentation

## 2015-07-31 DIAGNOSIS — K449 Diaphragmatic hernia without obstruction or gangrene: Secondary | ICD-10-CM | POA: Insufficient documentation

## 2015-07-31 DIAGNOSIS — R1310 Dysphagia, unspecified: Secondary | ICD-10-CM | POA: Diagnosis not present

## 2015-07-31 DIAGNOSIS — F17200 Nicotine dependence, unspecified, uncomplicated: Secondary | ICD-10-CM | POA: Diagnosis not present

## 2015-07-31 DIAGNOSIS — R1013 Epigastric pain: Secondary | ICD-10-CM | POA: Insufficient documentation

## 2015-07-31 DIAGNOSIS — K920 Hematemesis: Secondary | ICD-10-CM | POA: Insufficient documentation

## 2015-07-31 HISTORY — PX: ESOPHAGOGASTRODUODENOSCOPY (EGD) WITH PROPOFOL: SHX5813

## 2015-07-31 LAB — URINE DRUG SCREEN, QUALITATIVE (ARMC ONLY)
Amphetamines, Ur Screen: NOT DETECTED
BENZODIAZEPINE, UR SCRN: NOT DETECTED
Barbiturates, Ur Screen: POSITIVE — AB
CANNABINOID 50 NG, UR ~~LOC~~: POSITIVE — AB
Cocaine Metabolite,Ur ~~LOC~~: NOT DETECTED
MDMA (Ecstasy)Ur Screen: NOT DETECTED
Methadone Scn, Ur: NOT DETECTED
Opiate, Ur Screen: NOT DETECTED
PHENCYCLIDINE (PCP) UR S: NOT DETECTED
Tricyclic, Ur Screen: NOT DETECTED

## 2015-07-31 LAB — ELECTROLYTE PANEL
Anion gap: 12 (ref 5–15)
CO2: 30 mmol/L (ref 22–32)
Chloride: 89 mmol/L — ABNORMAL LOW (ref 101–111)
POTASSIUM: 2.8 mmol/L — AB (ref 3.5–5.1)
SODIUM: 131 mmol/L — AB (ref 135–145)

## 2015-07-31 SURGERY — ESOPHAGOGASTRODUODENOSCOPY (EGD) WITH PROPOFOL
Anesthesia: General

## 2015-07-31 MED ORDER — FENTANYL CITRATE (PF) 100 MCG/2ML IJ SOLN: 25.0000 ug | INTRAMUSCULAR | Status: DC | PRN

## 2015-07-31 MED ORDER — LIDOCAINE HCL (CARDIAC) 20 MG/ML IV SOLN
INTRAVENOUS | Status: DC | PRN
  Administered 2015-07-31: 40 mg via INTRAVENOUS

## 2015-07-31 MED ORDER — PROPOFOL 500 MG/50ML IV EMUL
INTRAVENOUS | Status: DC | PRN
  Administered 2015-07-31: 180 ug/kg/min via INTRAVENOUS

## 2015-07-31 MED ORDER — PROPOFOL 10 MG/ML IV BOLUS
INTRAVENOUS | Status: DC | PRN
  Administered 2015-07-31: 50 mg via INTRAVENOUS

## 2015-07-31 MED ORDER — ONDANSETRON HCL 4 MG/2ML IJ SOLN: 4.0000 mg | Freq: Once | INTRAMUSCULAR | Status: DC | PRN

## 2015-07-31 MED ORDER — FENTANYL CITRATE (PF) 100 MCG/2ML IJ SOLN
INTRAMUSCULAR | Status: DC | PRN
  Administered 2015-07-31: 50 ug via INTRAVENOUS

## 2015-07-31 MED ORDER — SODIUM CHLORIDE 0.9 % IV SOLN
INTRAVENOUS | Status: DC
  Administered 2015-07-31: 1000 mL via INTRAVENOUS

## 2015-07-31 MED ORDER — MIDAZOLAM HCL 2 MG/2ML IJ SOLN
INTRAMUSCULAR | Status: DC | PRN
  Administered 2015-07-31: 1 mg via INTRAVENOUS

## 2015-07-31 MED ORDER — SODIUM CHLORIDE 0.9 % IV SOLN
INTRAVENOUS | Status: DC
  Administered 2015-07-31: 09:00:00 via INTRAVENOUS

## 2015-07-31 NOTE — Anesthesia Preprocedure Evaluation (Signed)
Anesthesia Evaluation  Patient identified by MRN, date of birth, ID band Patient awake    Reviewed: Allergy & Precautions, NPO status , Patient's Chart, lab work & pertinent test results  Airway Mallampati: II       Dental no notable dental hx.    Pulmonary Current Smoker,    Pulmonary exam normal breath sounds clear to auscultation       Cardiovascular negative cardio ROS Normal cardiovascular exam     Neuro/Psych negative neurological ROS  negative psych ROS   GI/Hepatic GERD  Medicated,  Endo/Other  negative endocrine ROS  Renal/GU negative Renal ROS  negative genitourinary   Musculoskeletal negative musculoskeletal ROS (+)   Abdominal Normal abdominal exam  (+)   Peds negative pediatric ROS (+)  Hematology negative hematology ROS (+)   Anesthesia Other Findings   Reproductive/Obstetrics                             Anesthesia Physical Anesthesia Plan  ASA: II  Anesthesia Plan: General   Post-op Pain Management:    Induction: Intravenous  Airway Management Planned: Nasal Cannula  Additional Equipment:   Intra-op Plan:   Post-operative Plan:   Informed Consent: I have reviewed the patients History and Physical, chart, labs and discussed the procedure including the risks, benefits and alternatives for the proposed anesthesia with the patient or authorized representative who has indicated his/her understanding and acceptance.   Dental advisory given  Plan Discussed with: CRNA and Surgeon  Anesthesia Plan Comments:         Anesthesia Quick Evaluation

## 2015-07-31 NOTE — Anesthesia Procedure Notes (Signed)
Date/Time: 07/31/2015 9:08 AM Performed by: Stormy FabianURTIS, Katalyn Matin Pre-anesthesia Checklist: Patient identified, Emergency Drugs available, Suction available and Patient being monitored Patient Re-evaluated:Patient Re-evaluated prior to inductionOxygen Delivery Method: Nasal cannula Intubation Type: IV induction Dental Injury: Teeth and Oropharynx as per pre-operative assessment  Comments: Nasal cannula with etCO2 monitoring

## 2015-07-31 NOTE — Op Note (Signed)
[facility] Gastroenterology Patient Name: Christine Shields Procedure Date: 07/31/2015 9:09 AM MRN: [id number] Account #: [id number] Date of Birth: 10/28/1990 Admit Type: Outpatient Age: [age] Room: [facility] ROOM 4 Gender: Female Note Status: Finalized Procedure:            Upper GI endoscopy Indications:          Epigastric abdominal pain, Hematemesis Providers:            Ezzard StandingPaul Y. Bluford Kaufmannh, MD Medicines:            Monitored Anesthesia Care Complications:        No immediate complications. Procedure:            Pre-Anesthesia Assessment:                       - Prior to the procedure, a History and Physical was                        performed, and patient medications, allergies and                        sensitivities were reviewed. The patient's tolerance of                        previous anesthesia was reviewed.                       - The risks and benefits of the procedure and the                        sedation options and risks were discussed with the                        patient. All questions were answered and informed                        consent was obtained.                       - After reviewing the risks and benefits, the patient                        was deemed in satisfactory condition to undergo the                        procedure.                       After obtaining informed consent, the endoscope was                        passed under direct vision. Throughout the procedure,                        the patient's blood pressure, pulse, and oxygen                        saturations were monitored continuously. The Endoscope                        was introduced through the mouth, and advanced to the  second part of duodenum. The upper GI endoscopy was                        accomplished without difficulty. The patient tolerated                        the procedure well. Findings:      LA Grade C (one or more mucosal breaks  continuous between tops of 2 or       more mucosal folds, less than 75% circumference) esophagitis with no       bleeding was found in the lower third of the esophagus. Biopsies were       taken with a cold forceps for histology.      The exam was otherwise without abnormality.      A small hiatal hernia was present.      Localized moderately erythematous mucosa was found in the gastric       fundus. Biopsies were taken with a cold forceps for histology. Biopsies       were taken with a cold forceps for histology.      The exam was otherwise without abnormality.      The examined duodenum was normal. Impression:           - LA Grade C reflux esophagitis. Biopsied.                       - The examination was otherwise normal.                       - Small hiatal hernia.                       - Erythematous mucosa in the gastric fundus. Biopsied.                       - The examination was otherwise normal.                       - Normal examined duodenum. Recommendation:       - Discharge patient to home.                       - Observe patient's clinical course.                       - Await pathology results.                       - The findings and recommendations were discussed with                        the patient.                       - PPI BID x 1 month and then QD afterwards. Procedure Code(s):    --- Professional ---                       [phone], Esophagogastroduodenoscopy, flexible, transoral;                        with biopsy, single or multiple Diagnosis Code(s):    --- Professional ---  K21.0, Gastro-esophageal reflux disease with esophagitis                       K44.9, Diaphragmatic hernia without obstruction or                        gangrene                       K31.89, Other diseases of stomach and duodenum                       R10.13, Epigastric pain                       K92.0, Hematemesis CPT copyright 2016 [employer].  All rights reserved. The codes documented in this report are preliminary and upon coder review may  be revised to meet current compliance requirements. Wallace Cullens, MD 07/31/2015 9:19:58 AM This report has been signed electronically. Number of Addenda: 0 Note Initiated On: 07/31/2015 9:09 AM      [facility]

## 2015-07-31 NOTE — H&P (Signed)
  Date of Initial H&P: 07/30/2015  History reviewed, patient examined, no change in status, stable for surgery. 

## 2015-07-31 NOTE — Anesthesia Postprocedure Evaluation (Signed)
Anesthesia Post Note  Patient: Christine Shields  Procedure(s) Performed: Procedure(s) (LRB): ESOPHAGOGASTRODUODENOSCOPY (EGD) WITH PROPOFOL (N/A)  Patient location during evaluation: PACU Anesthesia Type: General Level of consciousness: awake and alert and oriented Pain management: pain level controlled Vital Signs Assessment: post-procedure vital signs reviewed and stable Respiratory status: spontaneous breathing Cardiovascular status: blood pressure returned to baseline Anesthetic complications: no    Last Vitals:  Filed Vitals:   07/31/15 0947 07/31/15 0957  BP: 124/81 125/74  Pulse: 84 76  Temp:    Resp: 23 23    Last Pain: There were no vitals filed for this visit.               Ledonna Dormer

## 2015-07-31 NOTE — Transfer of Care (Signed)
Immediate Anesthesia Transfer of Care Note  Patient: Christine Shields  Procedure(s) Performed: Procedure(s): ESOPHAGOGASTRODUODENOSCOPY (EGD) WITH PROPOFOL (N/A)  Patient Location: PACU and Endoscopy Unit  Anesthesia Type:General  Level of Consciousness: sedated  Airway & Oxygen Therapy: Patient Spontanous Breathing and Patient connected to nasal cannula oxygen  Post-op Assessment: Report given to RN and Post -op Vital signs reviewed and stable  Post vital signs: Reviewed and stable  Last Vitals:  Filed Vitals:   07/31/15 0824 07/31/15 0927  BP: 133/77 122/88  Pulse: 88 94  Temp: 37.3 C 36.1 C  Resp: 18 23    Complications: No apparent anesthesia complications

## 2015-08-03 LAB — SURGICAL PATHOLOGY

## 2015-08-05 ENCOUNTER — Encounter: Payer: Self-pay | Admitting: Gastroenterology

## 2015-10-23 ENCOUNTER — Emergency Department
Admission: EM | Admit: 2015-10-23 | Discharge: 2015-10-24 | Disposition: A | Discharge status: Home / Self Care | Payer: Medicaid Other | Attending: Emergency Medicine | Admitting: Emergency Medicine

## 2015-10-23 ENCOUNTER — Encounter: Payer: Self-pay | Admitting: *Deleted

## 2015-10-23 DIAGNOSIS — F1210 Cannabis abuse, uncomplicated: Secondary | ICD-10-CM | POA: Diagnosis not present

## 2015-10-23 DIAGNOSIS — R1084 Generalized abdominal pain: Secondary | ICD-10-CM | POA: Diagnosis present

## 2015-10-23 DIAGNOSIS — K529 Noninfective gastroenteritis and colitis, unspecified: Secondary | ICD-10-CM | POA: Insufficient documentation

## 2015-10-23 DIAGNOSIS — F17210 Nicotine dependence, cigarettes, uncomplicated: Secondary | ICD-10-CM | POA: Diagnosis not present

## 2015-10-23 DIAGNOSIS — Z5181 Encounter for therapeutic drug level monitoring: Secondary | ICD-10-CM | POA: Diagnosis not present

## 2015-10-23 LAB — URINALYSIS COMPLETE WITH MICROSCOPIC (ARMC ONLY)
BACTERIA UA: NONE SEEN
Bilirubin Urine: NEGATIVE
GLUCOSE, UA: NEGATIVE mg/dL
Nitrite: NEGATIVE
PH: 7 (ref 5.0–8.0)
PROTEIN: 100 mg/dL — AB
Specific Gravity, Urine: 1.029 (ref 1.005–1.030)

## 2015-10-23 LAB — COMPREHENSIVE METABOLIC PANEL
ALBUMIN: 4.2 g/dL (ref 3.5–5.0)
ALT: 17 U/L (ref 14–54)
AST: 23 U/L (ref 15–41)
Alkaline Phosphatase: 76 U/L (ref 38–126)
Anion gap: 9 (ref 5–15)
BUN: 7 mg/dL (ref 6–20)
CHLORIDE: 110 mmol/L (ref 101–111)
CO2: 20 mmol/L — ABNORMAL LOW (ref 22–32)
Calcium: 9.3 mg/dL (ref 8.9–10.3)
Creatinine, Ser: 0.88 mg/dL (ref 0.44–1.00)
GFR calc Af Amer: >60 mL/min (ref >60)
Glucose, Bld: 115 mg/dL — ABNORMAL HIGH (ref 65–99)
POTASSIUM: 3.7 mmol/L (ref 3.5–5.1)
Sodium: 139 mmol/L (ref 135–145)
Total Bilirubin: 0.5 mg/dL (ref 0.3–1.2)
Total Protein: 8 g/dL (ref 6.5–8.1)

## 2015-10-23 LAB — ETHANOL

## 2015-10-23 LAB — CBC
HEMATOCRIT: 42.5 % (ref 35.0–47.0)
HEMOGLOBIN: 14.8 g/dL (ref 12.0–16.0)
MCH: 32.8 pg (ref 26.0–34.0)
MCHC: 34.7 g/dL (ref 32.0–36.0)
MCV: 94.5 fL (ref 80.0–100.0)
Platelets: 302 10*3/uL (ref 150–440)
RBC: 4.5 MIL/uL (ref 3.80–5.20)
RDW: 14 % (ref 11.5–14.5)
WBC: 16.1 10*3/uL — AB (ref 3.6–11.0)

## 2015-10-23 LAB — LIPASE, BLOOD: LIPASE: 22 U/L (ref 11–51)

## 2015-10-23 LAB — POCT PREGNANCY, URINE: PREG TEST UR: NEGATIVE

## 2015-10-23 MED ORDER — SODIUM CHLORIDE 0.9 % IV BOLUS (SEPSIS)
500.0000 mL | Freq: Once | INTRAVENOUS | Status: AC
  Administered 2015-10-23: 500 mL via INTRAVENOUS

## 2015-10-23 MED ORDER — HALOPERIDOL LACTATE 5 MG/ML IJ SOLN
5.0000 mg | Freq: Once | INTRAMUSCULAR | Status: AC
  Administered 2015-10-23: 5 mg via INTRAVENOUS
  Filled 2015-10-23: qty 1

## 2015-10-23 MED ORDER — FENTANYL CITRATE (PF) 100 MCG/2ML IJ SOLN
50.0000 ug | INTRAMUSCULAR | Status: DC | PRN
  Administered 2015-10-23: 50 ug via NASAL
  Filled 2015-10-23: qty 2

## 2015-10-23 MED ORDER — FAMOTIDINE IN NACL 20-0.9 MG/50ML-% IV SOLN
20.0000 mg | Freq: Once | INTRAVENOUS | Status: AC
  Administered 2015-10-23: 20 mg via INTRAVENOUS
  Filled 2015-10-23: qty 50

## 2015-10-23 MED ORDER — ONDANSETRON 4 MG PO TBDP
ORAL_TABLET | ORAL | Status: DC
Start: 2015-10-23 — End: 2015-10-24
  Filled 2015-10-23: qty 1

## 2015-10-23 MED ORDER — ONDANSETRON 4 MG PO TBDP
4.0000 mg | ORAL_TABLET | Freq: Once | ORAL | Status: AC | PRN
  Administered 2015-10-23: 4 mg via ORAL

## 2015-10-23 NOTE — ED Notes (Signed)
Pt observed sticking finger into mouth to try to force herself to vomit. Pt observed refusing to keep oral zofran in her mouth and spitting it into the toilet. Pt is crying uncontrollably in triage when interacting w/ caregivers. Pt stated it was on her tongue and was told it was supposed to be on her tongue.

## 2015-10-23 NOTE — ED Notes (Signed)
Pt presents w/ R sided abdominal cramping. Pt states she has monthly uncontrolled pain related to her period and has experienced this for past 4 yrs.

## 2015-10-23 NOTE — ED Provider Notes (Signed)
[facility] Emergency Department Provider Note   ____________________________________________  Time seen: Approximately 11:22 PM  I have reviewed the triage vital signs and the nursing notes.   HISTORY  Chief Complaint Abdominal Cramping  Limited by odd affect  HPI Christine Shields is a 25 y.o. female who presents to the ED from home with a chief complaint of abdominal cramping. Patient reports she has monthly uncontrolled pain and related to her period; has experienced this for the past 4 years. Patient also has a history of alcoholic gastritis; endoscopy in April 2017 demonstrated hiatal hernia, esophagitis and gastritis.Complains of a 2 day history of "cramps". Denies associated fever, chills, chest pain, shortness of breath, nausea, vomiting, diarrhea. Denies recent travel or trauma. Nothing makes her symptoms better or worse.    Past Medical History  Diagnosis Date  . GERD (gastroesophageal reflux disease)     There are no active problems to display for this patient.   Past Surgical History  Procedure Laterality Date  . Esophagogastroduodenoscopy (egd) with propofol N/A 07/31/2015    Procedure: ESOPHAGOGASTRODUODENOSCOPY (EGD) WITH PROPOFOL;  Surgeon: Wallace CullensPaul Y Oh, MD;  [facility];  Service: Gastroenterology;  Laterality: N/A;    Current Outpatient Rx  Name  Route  Sig  Dispense  Refill  . Alum & Mag Hydroxide-Simeth (GI COCKTAIL) SUSP suspension   Oral   Take 30 mLs by mouth 2 (two) times daily as needed for indigestion. Shake well.   150 mL   0   . esomeprazole (NEXIUM) 40 MG capsule   Oral   Take 40 mg by mouth daily at 12 noon.         Marland Kitchen. HYDROcodone-acetaminophen (NORCO/VICODIN) 5-325 MG tablet   Oral   Take 1 tablet by mouth every 4 (four) hours as needed for moderate pain.   20 tablet   0   . ondansetron (ZOFRAN) 4 MG tablet      Take 1-2 tabs by mouth every 8 hours as needed for nausea/vomiting   30 tablet  0   . pantoprazole (PROTONIX) 40 MG tablet   Oral   Take 1 tablet (40 mg total) by mouth daily.   30 tablet   1   . potassium chloride (K-DUR) 10 MEQ tablet   Oral   Take 1 tablet (10 mEq total) by mouth 2 (two) times daily.   14 tablet   0   . promethazine (PHENERGAN) 25 MG tablet   Oral   Take 1 tablet (25 mg total) by mouth every 6 (six) hours as needed for nausea or vomiting.   20 tablet   0   . sucralfate (CARAFATE) 1 g tablet   Oral   Take 1 tablet (1 g total) by mouth 4 (four) times daily as needed (for abdominal discomfort, nausea, and/or vomiting).   30 tablet   1     Allergies Review of patient's allergies indicates no known allergies.  History reviewed. No pertinent family history.  Social History Social History  Substance Use Topics  . Smoking status: Current Every Day Smoker -- 0.50 packs/day    Types: Cigarettes  . Smokeless tobacco: None  . Alcohol Use: Yes    Review of Systems  Constitutional: No fever/chills. Eyes: No visual changes. ENT: No sore throat. Cardiovascular: Denies chest pain. Respiratory: Denies shortness of breath. Gastrointestinal: Positive for abdominal pain.  No nausea, no vomiting.  No diarrhea.  No constipation. Genitourinary: Negative for dysuria. Musculoskeletal: Negative for back pain. Skin:  Negative for rash. Neurological: Negative for headaches, focal weakness or numbness.  10-point ROS otherwise negative.  ____________________________________________   PHYSICAL EXAM:  VITAL SIGNS: ED Triage Vitals  Enc Vitals Group     BP 10/23/15 2145 156/95 mmHg     Pulse Rate 10/23/15 2145 66     Resp 10/23/15 2145 24     Temp 10/23/15 2145 98.2 F (36.8 C)     Temp Source 10/23/15 2145 Oral     SpO2 10/23/15 2145 100 %     Weight 10/23/15 2145 170 lb (77.111 kg)     Height 10/23/15 2145  (1.626 m)     Head Cir --      Peak Flow --      Pain Score --      Pain Loc --      Pain Edu? --      Excl. in GC? --      Constitutional: Alert and oriented. Disheveled appearing and in mild acute distress. Loud outbursts. Eyes: Conjunctivae are normal. PERRL. EOMI. Head: Atraumatic. Nose: No congestion/rhinnorhea. Mouth/Throat: Mucous membranes are moist.  Oropharynx non-erythematous. Neck: No stridor.   Cardiovascular: Normal rate, regular rhythm. Grossly normal heart sounds.  Good peripheral circulation. Respiratory: Normal respiratory effort.  No retractions. Lungs CTAB. Gastrointestinal: Difficult exam secondary to patient uncooperative. Soft and mildly tender to palpation diffusely without rebound or guarding. No distention. No abdominal bruits. No CVA tenderness. Musculoskeletal: No lower extremity tenderness nor edema.  No joint effusions. Neurologic:  Normal speech and language. No gross focal neurologic deficits are appreciated.  Skin:  Skin is warm, dry and intact. No rash noted. Psychiatric: Mood and affect are mildly agitated. Speech and behavior are normal.  ____________________________________________   LABS (all labs ordered are listed, but only abnormal results are displayed)  Labs Reviewed  COMPREHENSIVE METABOLIC PANEL - Abnormal; Notable for the following:    CO2 20 (*)    Glucose, Bld 115 (*)    All other components within normal limits  CBC - Abnormal; Notable for the following:    WBC 16.1 (*)    All other components within normal limits  URINALYSIS COMPLETEWITH MICROSCOPIC ([facility] ONLY) - Abnormal; Notable for the following:    Color, Urine YELLOW (*)    APPearance HAZY (*)    Ketones, ur 1+ (*)    Hgb urine dipstick 3+ (*)    Protein, ur 100 (*)    Leukocytes, UA TRACE (*)    Squamous Epithelial / LPF 0-5 (*)    All other components within normal limits  URINE DRUG SCREEN, QUALITATIVE ([facility] ONLY) - Abnormal; Notable for the following:    Cannabinoid 50 Ng, Ur [place] POSITIVE (*)    All other components within normal limits  LIPASE, BLOOD  ETHANOL  POC URINE PREG, ED    POCT PREGNANCY, URINE   ____________________________________________  EKG  None ____________________________________________  RADIOLOGY  CT abdomen and pelvis interpreted per Dr. Gwenyth Bender: Findings most compatible with colitis. Correlation with clinical exam and stool cultures recommended. No evidence of bowel obstruction. Normal appendix. ____________________________________________   PROCEDURES  Procedure(s) performed: None  Procedures  Critical Care performed: No  ____________________________________________   INITIAL IMPRESSION / ASSESSMENT AND PLAN / ED COURSE  Pertinent labs & imaging results that were available during my care of the patient were reviewed by me and considered in my medical decision making (see chart for details).  25 year old female who presents with abdominal cramping which occurs monthly with her menstrual  cycles. Question if patient has mild MR or psychiatric disease given her bizarre affect and loud outbursts. Laboratory and urinalysis results remarkable for leukocytosis, cannabinoids and urine. Given limited history and exam, will proceed with CT abdomen/pelvis to evaluate for intra-abdominal etiology.  ----------------------------------------- 1:00 AM on 10/24/2015 -----------------------------------------  Good improvement with IV Haldol infusion.  ----------------------------------------- 3:09 AM on 10/24/2015 -----------------------------------------  Awakening patient from sleep to update her on CT imaging results. Plan for prescriptions of Cipro and Flagyl, and patient will follow-up with her PCP early next week. Strict return precautions given. Patient verbalizes understanding and agrees with plan of care.  ----------------------------------------- 6:49 AM on 10/24/2015 -----------------------------------------  Addendum: Patient made herself vomit prior to discharge. She was given IV Zofran with good effect. Tolerated ice  chips without emesis and discharged home without incident. ____________________________________________   FINAL CLINICAL IMPRESSION(S) / ED DIAGNOSES  Final diagnoses:  Generalized abdominal pain  Colitis  Marijuana abuse      NEW MEDICATIONS STARTED DURING THIS VISIT:  New Prescriptions   No medications on file     Note:  This document was prepared using Dragon voice recognition software and may include unintentional dictation errors.    Irean Hong, MD 10/24/15 [phone]

## 2015-10-24 ENCOUNTER — Emergency Department: Payer: Medicaid Other

## 2015-10-24 LAB — URINE DRUG SCREEN, QUALITATIVE (ARMC ONLY)
AMPHETAMINES, UR SCREEN: NOT DETECTED
Barbiturates, Ur Screen: NOT DETECTED
Benzodiazepine, Ur Scrn: NOT DETECTED
CANNABINOID 50 NG, UR ~~LOC~~: POSITIVE — AB
COCAINE METABOLITE, UR ~~LOC~~: NOT DETECTED
MDMA (ECSTASY) UR SCREEN: NOT DETECTED
Methadone Scn, Ur: NOT DETECTED
Opiate, Ur Screen: NOT DETECTED
Phencyclidine (PCP) Ur S: NOT DETECTED
TRICYCLIC, UR SCREEN: NOT DETECTED

## 2015-10-24 MED ORDER — ONDANSETRON HCL 4 MG/2ML IJ SOLN
4.0000 mg | Freq: Once | INTRAMUSCULAR | Status: AC
  Administered 2015-10-24: 4 mg via INTRAVENOUS

## 2015-10-24 MED ORDER — ONDANSETRON 4 MG PO TBDP: 4.0000 mg | ORAL_TABLET | Freq: Three times a day (TID) | ORAL | Status: DC | PRN

## 2015-10-24 MED ORDER — CIPROFLOXACIN HCL 500 MG PO TABS: 500.0000 mg | ORAL_TABLET | Freq: Two times a day (BID) | ORAL | Status: DC

## 2015-10-24 MED ORDER — METRONIDAZOLE 500 MG PO TABS
500.0000 mg | ORAL_TABLET | Freq: Once | ORAL | Status: AC
  Administered 2015-10-24: 500 mg via ORAL
  Filled 2015-10-24: qty 1

## 2015-10-24 MED ORDER — IOPAMIDOL (ISOVUE-300) INJECTION 61%
100.0000 mL | Freq: Once | INTRAVENOUS | Status: AC | PRN
  Administered 2015-10-24: 100 mL via INTRAVENOUS

## 2015-10-24 MED ORDER — ONDANSETRON HCL 4 MG/2ML IJ SOLN
INTRAMUSCULAR | Status: AC
  Administered 2015-10-24: 4 mg via INTRAVENOUS
  Filled 2015-10-24: qty 2

## 2015-10-24 MED ORDER — OXYCODONE-ACETAMINOPHEN 5-325 MG PO TABS: 1.0000 | ORAL_TABLET | ORAL | Status: DC | PRN

## 2015-10-24 MED ORDER — METRONIDAZOLE 500 MG PO TABS: 500.0000 mg | ORAL_TABLET | Freq: Two times a day (BID) | ORAL | Status: DC

## 2015-10-24 MED ORDER — CIPROFLOXACIN HCL 500 MG PO TABS
500.0000 mg | ORAL_TABLET | Freq: Once | ORAL | Status: AC
  Administered 2015-10-24: 500 mg via ORAL
  Filled 2015-10-24: qty 1

## 2015-10-24 MED ORDER — DIATRIZOATE MEGLUMINE & SODIUM 66-10 % PO SOLN
15.0000 mL | Freq: Once | ORAL | Status: AC
  Administered 2015-10-24: 15 mL via ORAL

## 2015-10-24 NOTE — ED Notes (Signed)
Patient was actively vomiting when I entered the room, asking MD for VORB Zofran 4mg  IV

## 2015-10-24 NOTE — Discharge Instructions (Signed)
1. Take antibiotics as prescribed (Cipro/Flagyl 500 mg twice daily 7 days). 2. You may take pain and nausea medicines as needed (Percocet/Zofran #15). 3. Return to the ER for worsening symptoms, persistent vomiting, fever or other concerns.  Abdominal Pain, Adult Many things can cause abdominal pain. Usually, abdominal pain is not caused by a disease and will improve without treatment. It can often be observed and treated at home. Your health care provider will do a physical exam and possibly order blood tests and X-rays to help determine the seriousness of your pain. However, in many cases, more time must pass before a clear cause of the pain can be found. Before that point, your health care provider may not know if you need more testing or further treatment. HOME CARE INSTRUCTIONS Monitor your abdominal pain for any changes. The following actions may help to alleviate any discomfort you are experiencing:  Only take over-the-counter or prescription medicines as directed by your health care provider.  Do not take laxatives unless directed to do so by your health care provider.  Try a clear liquid diet (broth, tea, or water) as directed by your health care provider. Slowly move to a bland diet as tolerated. SEEK MEDICAL CARE IF:  You have unexplained abdominal pain.  You have abdominal pain associated with nausea or diarrhea.  You have pain when you urinate or have a bowel movement.  You experience abdominal pain that wakes you in the night.  You have abdominal pain that is worsened or improved by eating food.  You have abdominal pain that is worsened with eating fatty foods.  You have a fever. SEEK IMMEDIATE MEDICAL CARE IF:  Your pain does not go away within 2 hours.  You keep throwing up (vomiting).  Your pain is felt only in portions of the abdomen, such as the right side or the left lower portion of the abdomen.  You pass bloody or black tarry stools. MAKE SURE  YOU:  Understand these instructions.  Will watch your condition.  Will get help right away if you are not doing well or get worse.   This information is not intended to replace advice given to you by your health care provider. Make sure you discuss any questions you have with your health care provider.   Document Released: 01/05/2005 Document Revised: 12/17/2014 Document Reviewed: 12/05/2012 Elsevier Interactive Patient Education 2016 Elsevier Inc.  Colitis Colitis is inflammation of the colon. Colitis may last a short time (acute) or it may last a long time (chronic). CAUSES This condition may be caused by:  Viruses.  Bacteria.  Reactions to medicine.  Certain autoimmune diseases, such as Crohn disease or ulcerative colitis. SYMPTOMS Symptoms of this condition include:  Diarrhea.  Passing bloody or tarry stool.  Pain.  Fever.  Vomiting.  Tiredness (fatigue).  Weight loss.  Bloating.  Sudden increase in abdominal pain.  Having fewer bowel movements than usual. DIAGNOSIS This condition is diagnosed with a stool test or a blood test. You may also have other tests, including X-rays, a CT scan, or a colonoscopy. TREATMENT Treatment may include:  Resting the bowel. This involves not eating or drinking for a period of time.  Fluids that are given through an IV tube.  Medicine for pain and diarrhea.  Antibiotic medicines.  Cortisone medicines.  Surgery. HOME CARE INSTRUCTIONS Eating and Drinking  Follow instructions from your health care provider about eating or drinking restrictions.  Drink enough fluid to keep your urine clear or pale yellow.  Work with a dietitian to determine which foods cause your condition to flare up.  Avoid foods that cause flare-ups.  Eat a well-balanced diet. Medicines  Take over-the-counter and prescription medicines only as told by your health care provider.  If you were prescribed an antibiotic medicine, take it as  told by your health care provider. Do not stop taking the antibiotic even if you start to feel better. General Instructions  Keep all follow-up visits as told by your health care provider. This is important. SEEK MEDICAL CARE IF:  Your symptoms do not go away.  You develop new symptoms. SEEK IMMEDIATE MEDICAL CARE IF:  You have a fever that does not go away with treatment.  You develop chills.  You have extreme weakness, fainting, or dehydration.  You have repeated vomiting.  You develop severe pain in your abdomen.  You pass bloody or tarry stool.   This information is not intended to replace advice given to you by your health care provider. Make sure you discuss any questions you have with your health care provider.   Document Released: 05/05/2004 Document Revised: 12/17/2014 Document Reviewed: 07/21/2014 Elsevier Interactive Patient Education 2016 [employer]  Cannabis Use Disorder Cannabis use disorder is a mental disorder. It is not one-time or occasional use of cannabis, more commonly known as marijuana. Cannabis use disorder is the continued, nonmedical use of cannabis that interferes with normal life activities or causes health problems. People with cannabis use disorder get a feeling of extreme pleasure and relaxation from cannabis use. This "high" is very rewarding and causes people to use over and over.  The mind-altering ingredient in cannabis is know as THC. THC can also interfere with motor coordination, memory, judgment, and accurate sense of space and time. These effects can last for a few days after using cannabis. Regular heavy cannabis use can cause long-lasting problems with thinking and learning. In young people, these problems may be permanent. Cannabis sometimes causes severe anxiety, paranoia, or visual hallucinations. Man-made (synthetic) cannabis-like drugs, such as "spice" and "K2," cause the same effects as THC but are much stronger. Cannabis-like drugs  can cause dangerously high blood pressure and heart rate.  Cannabis use disorder usually starts in the teenage years. It can trigger the development of schizophrenia. It is somewhat more common in men than women. People who have family members with the disorder or existing mental health issues such as depression and posttraumatic stress disorderare more likely to develop cannabis use disorder. People with cannabis use disorder are at higher risk for use of other drugs of abuse.  SIGNS AND SYMPTOMS Signs and symptoms of cannabis use disorder include:   Use of cannabis in larger amounts or over a longer period than intended.   Unsuccessful attempts to cut down or control cannabis use.   A lot of time spent obtaining, using, or recovering from the effects of cannabis.   A strong desire or urge to use cannabis (cravings).   Continued use of cannabis in spite of problems at work, school, or home because of use.   Continued use of cannabis in spite of relationship problems because of use.  Giving up or cutting down on important life activities because of cannabis use.  Use of cannabis over and over even in situations when it is physically hazardous, such as when driving a car.   Continued use of cannabis in spite of a physical problem that is likely related to use. Physical problems can include:  Chronic cough.  Bronchitis.  Emphysema.  Throat and lung cancer.  Continued use of cannabis in spite of a mental problem that is likely related to use. Mental problems can include:  Psychosis.  Anxiety.  Difficulty sleeping.  Need to use more and more cannabis to get the same effect, or lessened effect over time with use of the same amount (tolerance).  Having withdrawal symptoms when cannabis use is stopped, or using cannabis to reduce or avoid withdrawal symptoms. Withdrawal symptoms include:  Irritability or anger.  Anxiety or restlessness.  Difficulty sleeping.  Loss of  appetite or weight.  Aches and pains.  Shakiness.  Sweating.  Chills. DIAGNOSIS Cannabis use disorder is diagnosed by your health care provider. You may be asked questions about your cannabis use and how it affects your life. A physical exam may be done. A drug screen may be done. You may be referred to a mental health professional. The diagnosis of cannabis use disorder requires at least two symptoms within 12 months. The type of cannabis use disorder you have depends on the number of symptoms you have. The type may be:  Mild. Two or three signs and symptoms.   Moderate. Four or five signs and symptoms.   Severe. Six or more signs and symptoms.  TREATMENT Treatment is usually provided by mental health professionals with training in substance use disorders. The following options are available:  Counseling or talk therapy. Talk therapy addresses the reasons you use cannabis. It also addresses ways to keep you from using again. The goals of talk therapy include:  Identifying and avoiding triggers for use.  Learning how to handle cravings.  Replacing use with healthy activities.  Support groups. Support groups provide emotional support, advice, and guidance.  Medicine. Medicine is used to treat mental health issues that trigger cannabis use or that result from it. HOME CARE INSTRUCTIONS  Take medicines only as directed by your health care provider.  Check with your health care provider before starting any new medicines.  Keep all follow-up visits as directed by your health care provider. SEEK MEDICAL CARE IF:  You are not able to take your medicines as directed.  Your symptoms get worse. SEEK IMMEDIATE MEDICAL CARE IF: You have serious thoughts about hurting yourself or others. FOR MORE INFORMATION  National Institute on Drug Abuse: http://www.price-smith.com/  Substance Abuse and Mental Health Services Administration: SkateOasis.com.pt   This information is not intended to  replace advice given to you by your health care provider. Make sure you discuss any questions you have with your health care provider.   Document Released: 03/25/2000 Document Revised: 04/18/2014 Document Reviewed: 04/10/2013 Elsevier Interactive Patient Education [employer]

## 2015-10-24 NOTE — ED Notes (Signed)
Patient trying to see if she can get transportation home

## 2015-10-24 NOTE — ED Notes (Signed)
Patient says her brother is coming to pick her up, getting a wheelchair

## 2016-01-04 ENCOUNTER — Emergency Department: Payer: Medicaid Other

## 2016-01-04 ENCOUNTER — Encounter: Payer: Self-pay | Admitting: Specialist

## 2016-01-04 ENCOUNTER — Inpatient Hospital Stay
Admission: EM | Admit: 2016-01-04 | Discharge: 2016-01-06 | DRG: 155 | Disposition: A | Discharge status: Home / Self Care | Payer: Medicaid Other | Attending: Internal Medicine | Admitting: Internal Medicine

## 2016-01-04 DIAGNOSIS — K219 Gastro-esophageal reflux disease without esophagitis: Secondary | ICD-10-CM | POA: Diagnosis present

## 2016-01-04 DIAGNOSIS — J029 Acute pharyngitis, unspecified: Secondary | ICD-10-CM | POA: Diagnosis not present

## 2016-01-04 DIAGNOSIS — E871 Hypo-osmolality and hyponatremia: Secondary | ICD-10-CM | POA: Diagnosis present

## 2016-01-04 DIAGNOSIS — F17210 Nicotine dependence, cigarettes, uncomplicated: Secondary | ICD-10-CM | POA: Diagnosis present

## 2016-01-04 DIAGNOSIS — J36 Peritonsillar abscess: Secondary | ICD-10-CM

## 2016-01-04 DIAGNOSIS — E876 Hypokalemia: Secondary | ICD-10-CM | POA: Diagnosis present

## 2016-01-04 DIAGNOSIS — J351 Hypertrophy of tonsils: Secondary | ICD-10-CM | POA: Diagnosis present

## 2016-01-04 DIAGNOSIS — J039 Acute tonsillitis, unspecified: Secondary | ICD-10-CM | POA: Diagnosis present

## 2016-01-04 HISTORY — DX: Gastritis, unspecified, without bleeding: K29.70

## 2016-01-04 LAB — COMPREHENSIVE METABOLIC PANEL
ALK PHOS: 68 U/L (ref 38–126)
ALT: 18 U/L (ref 14–54)
AST: 15 U/L (ref 15–41)
Albumin: 3.5 g/dL (ref 3.5–5.0)
Anion gap: 9 (ref 5–15)
BUN: 5 mg/dL — AB (ref 6–20)
CALCIUM: 8.7 mg/dL — AB (ref 8.9–10.3)
CHLORIDE: 104 mmol/L (ref 101–111)
CO2: 21 mmol/L — ABNORMAL LOW (ref 22–32)
CREATININE: 0.8 mg/dL (ref 0.44–1.00)
GFR calc Af Amer: >60 mL/min (ref >60)
Glucose, Bld: 85 mg/dL (ref 65–99)
Potassium: 3.3 mmol/L — ABNORMAL LOW (ref 3.5–5.1)
Sodium: 134 mmol/L — ABNORMAL LOW (ref 135–145)
Total Bilirubin: 0.9 mg/dL (ref 0.3–1.2)
Total Protein: 7.7 g/dL (ref 6.5–8.1)

## 2016-01-04 LAB — CBC WITH DIFFERENTIAL/PLATELET
BASOS PCT: 0 %
Basophils Absolute: 0 10*3/uL (ref 0–0.1)
EOS ABS: 0 10*3/uL (ref 0–0.7)
EOS PCT: 0 %
HEMATOCRIT: 41.1 % (ref 35.0–47.0)
HEMOGLOBIN: 14.2 g/dL (ref 12.0–16.0)
LYMPHS ABS: 2.7 10*3/uL (ref 1.0–3.6)
Lymphocytes Relative: 11 %
MCH: 33.1 pg (ref 26.0–34.0)
MCHC: 34.7 g/dL (ref 32.0–36.0)
MCV: 95.5 fL (ref 80.0–100.0)
MONO ABS: 2.2 10*3/uL — AB (ref 0.2–0.9)
Monocytes Relative: 9 %
NEUTROS ABS: 19.6 10*3/uL — AB (ref 1.4–6.5)
NEUTROS PCT: 80 %
PLATELETS: 225 10*3/uL (ref 150–440)
RBC: 4.3 MIL/uL (ref 3.80–5.20)
RDW: 13.8 % (ref 11.5–14.5)
WBC: 24.5 10*3/uL — ABNORMAL HIGH (ref 3.6–11.0)

## 2016-01-04 LAB — POCT RAPID STREP A: STREPTOCOCCUS, GROUP A SCREEN (DIRECT): NEGATIVE

## 2016-01-04 MED ORDER — DEXAMETHASONE SODIUM PHOSPHATE 4 MG/ML IJ SOLN
4.0000 mg | Freq: Four times a day (QID) | INTRAMUSCULAR | Status: DC
  Administered 2016-01-04 – 2016-01-06 (×8): 4 mg via INTRAVENOUS
  Filled 2016-01-04 (×8): qty 1

## 2016-01-04 MED ORDER — LIDOCAINE VISCOUS 2 % MT SOLN
15.0000 mL | Freq: Once | OROMUCOSAL | Status: AC
  Administered 2016-01-04: 15 mL via OROMUCOSAL
  Filled 2016-01-04: qty 15

## 2016-01-04 MED ORDER — ACETAMINOPHEN 325 MG PO TABS
650.0000 mg | ORAL_TABLET | Freq: Four times a day (QID) | ORAL | Status: DC | PRN
Start: 2016-01-04 — End: 2016-01-06

## 2016-01-04 MED ORDER — ONDANSETRON HCL 4 MG PO TABS: 4.0000 mg | ORAL_TABLET | Freq: Four times a day (QID) | ORAL | Status: DC | PRN

## 2016-01-04 MED ORDER — PANTOPRAZOLE SODIUM 40 MG PO TBEC
40.0000 mg | DELAYED_RELEASE_TABLET | Freq: Every day | ORAL | Status: DC
  Administered 2016-01-05 – 2016-01-06 (×2): 40 mg via ORAL
  Filled 2016-01-04 (×2): qty 1

## 2016-01-04 MED ORDER — SODIUM CHLORIDE 0.9 % IV SOLN
3.0000 g | Freq: Once | INTRAVENOUS | Status: AC
  Administered 2016-01-04: 3 g via INTRAVENOUS
  Filled 2016-01-04: qty 3

## 2016-01-04 MED ORDER — SODIUM CHLORIDE 0.9 % IV SOLN
INTRAVENOUS | Status: DC
  Administered 2016-01-04 – 2016-01-06 (×4): via INTRAVENOUS

## 2016-01-04 MED ORDER — KETOROLAC TROMETHAMINE 30 MG/ML IJ SOLN
30.0000 mg | Freq: Four times a day (QID) | INTRAMUSCULAR | Status: DC | PRN
  Administered 2016-01-04 – 2016-01-05 (×3): 30 mg via INTRAVENOUS
  Filled 2016-01-04 (×3): qty 1

## 2016-01-04 MED ORDER — SODIUM CHLORIDE 0.9 % IV SOLN
3.0000 g | Freq: Four times a day (QID) | INTRAVENOUS | Status: DC
  Administered 2016-01-04 – 2016-01-06 (×7): 3 g via INTRAVENOUS
  Filled 2016-01-04 (×10): qty 3

## 2016-01-04 MED ORDER — SODIUM CHLORIDE 0.9 % IV SOLN
1000.0000 mL | Freq: Once | INTRAVENOUS | Status: AC
  Administered 2016-01-04: 1000 mL via INTRAVENOUS

## 2016-01-04 MED ORDER — ACETAMINOPHEN 650 MG RE SUPP: 650.0000 mg | Freq: Four times a day (QID) | RECTAL | Status: DC | PRN

## 2016-01-04 MED ORDER — DEXAMETHASONE SODIUM PHOSPHATE 10 MG/ML IJ SOLN
10.0000 mg | Freq: Once | INTRAMUSCULAR | Status: AC
Start: 2016-01-04 — End: 2016-01-04
  Administered 2016-01-04: 10 mg via INTRAVENOUS
  Filled 2016-01-04: qty 1

## 2016-01-04 MED ORDER — ONDANSETRON HCL 4 MG/2ML IJ SOLN: 4.0000 mg | Freq: Four times a day (QID) | INTRAMUSCULAR | Status: DC | PRN

## 2016-01-04 MED ORDER — ENOXAPARIN SODIUM 40 MG/0.4ML ~~LOC~~ SOLN
40.0000 mg | SUBCUTANEOUS | Status: DC
  Administered 2016-01-04: 40 mg via SUBCUTANEOUS
  Filled 2016-01-04: qty 0.4

## 2016-01-04 NOTE — H&P (Addendum)
[facility] [place] [facility]   PATIENT NAME: Christine Shields    MR#:  [id number]  DATE OF BIRTH:  10/02/1990  DATE OF ADMISSION:  01/04/2016  PRIMARY CARE PHYSICIAN: No PCP Per Patient   REQUESTING/REFERRING PHYSICIAN: Dr. Jene Everyobert Kinner  CHIEF COMPLAINT:   Chief Complaint  Patient presents with  . Sore Throat    HISTORY OF PRESENT ILLNESS:  Christine Shields  is a [age] y.o. female with a known history of GERD who presents to the hospital due to difficulty swallowing and throat pain. Patient says that she's been having difficulty swallowing and worsening throat pain for the past 2 days. She also had a fever 101 at home and therefore came to the ER for further evaluation. Patient underwent a soft tissue neck x-ray which showed an enlarged lymphoid tissue and her clinical exam was consistent with acute tonsillitis with exudates. The ER physician is spoke to ENT who recommended admission with IV antibiotics and IV steroids.  PAST MEDICAL HISTORY:   Past Medical History:  Diagnosis Date  . GERD (gastroesophageal reflux disease)     PAST SURGICAL HISTORY:   Past Surgical History:  Procedure Laterality Date  . ESOPHAGOGASTRODUODENOSCOPY (EGD) WITH PROPOFOL N/A 07/31/2015   Procedure: ESOPHAGOGASTRODUODENOSCOPY (EGD) WITH PROPOFOL;  Surgeon: Wallace CullensPaul Y Oh, MD;  [facility];  Service: Gastroenterology;  Laterality: N/A;    SOCIAL HISTORY:   Social History  Substance Use Topics  . Smoking status: Current Every Day Smoker    Packs/day: 0.50    Types: Cigarettes  . Smokeless tobacco: Not on file  . Alcohol use Yes    FAMILY HISTORY:   Family History  Problem Relation Age of Onset  . Diabetes Neg Hx   . Hypertension Neg Hx     DRUG ALLERGIES:   Allergies  Allergen Reactions  . Promethazine Hcl     Other reaction(s): Other (See Comments) Possible dystonic reaction    REVIEW OF SYSTEMS:   Review of Systems  Constitutional: Negative for  fever and weight loss.  HENT: Positive for sore throat. Negative for congestion, nosebleeds and tinnitus.   Eyes: Negative for blurred vision, double vision and redness.  Respiratory: Negative for cough, hemoptysis and shortness of breath.   Cardiovascular: Negative for chest pain, orthopnea, leg swelling and PND.  Gastrointestinal: Negative for abdominal pain, diarrhea, melena, nausea and vomiting.  Genitourinary: Negative for dysuria, hematuria and urgency.  Musculoskeletal: Negative for falls and joint pain.  Neurological: Negative for dizziness, tingling, sensory change, focal weakness, seizures, weakness and headaches.  Endo/Heme/Allergies: Negative for polydipsia. Does not bruise/bleed easily.  Psychiatric/Behavioral: Negative for depression and memory loss. The patient is not nervous/anxious.     MEDICATIONS AT HOME:   Prior to Admission medications   Medication Sig Start Date End Date Taking? Authorizing Provider  esomeprazole (NEXIUM) 40 MG capsule Take 40 mg by mouth daily at 12 noon.   Yes Historical Provider, MD      VITAL SIGNS:  Blood pressure 130/61, pulse (!) 102, temperature 100 F (37.8 C), temperature source Oral, height 5\' 4"  (1.626 m), weight 77.1 kg (170 lb), last menstrual period 01/04/2016, SpO2 98 %.  PHYSICAL EXAMINATION:  Physical Exam  GENERAL:  25 y.o.-year-old patient lying in the bed in no acute distress.  EYES: Pupils equal, round, reactive to light and accommodation. No scleral icterus. Extraocular muscles intact.  HEENT:  Atraumatic normocephalic, Oropharynx and nasopharynx clear. No oropharyngeal erythema, moist oral mucosa, b/l tonsillar exudates. NECK:  Positive neck swelling secondary to tonsillitis with exudates, no jugular venous distention. No thyroid enlargement, no tenderness.  LUNGS: Normal breath sounds bilaterally, no wheezing, rales, rhonchi. No use of accessory muscles of respiration.  CARDIOVASCULAR: S1, S2 RRR. No murmurs, rubs,  gallops, clicks.  ABDOMEN: Soft, nontender, nondistended. Bowel sounds present. No organomegaly or mass.  EXTREMITIES: No pedal edema, cyanosis, or clubbing. + 2 pedal & radial pulses b/l.   NEUROLOGIC: Cranial nerves II through XII are intact. No focal Motor or sensory deficits appreciated b/l PSYCHIATRIC: The patient is alert and oriented x 3. Good affect.  SKIN: No obvious rash, lesion, or ulcer.   LABORATORY PANEL:   CBC  Recent Labs Lab 01/04/16 1306  WBC 24.5*  HGB 14.2  HCT 41.1  PLT 225   ------------------------------------------------------------------------------------------------------------------  Chemistries   Recent Labs Lab 01/04/16 1306  NA 134*  K 3.3*  CL 104  CO2 21*  GLUCOSE 85  BUN 5*  CREATININE 0.80  CALCIUM 8.7*  AST 15  ALT 18  ALKPHOS 68  BILITOT 0.9   ------------------------------------------------------------------------------------------------------------------  Cardiac Enzymes No results for input(s): TROPONINI in the last 168 hours. ------------------------------------------------------------------------------------------------------------------  RADIOLOGY:  Dg Neck Soft Tissue  Result Date: 01/04/2016 CLINICAL DATA:  Severe sore throat.  Unable to swallow for 2 days EXAM: NECK SOFT TISSUES - 1+ VIEW COMPARISON:  None. FINDINGS: Posterior oropharynx and hypopharynx appear normal. No prevertebral soft tissue swelling or gas. Prominent lymphoid tissue in the posterior nasopharynx. Epiglottis and glottis appear normal. No foreign body IMPRESSION: 1. Prominent lymphoid tissue in the posterior nasopharynx. 2. Hypopharynx normal. Electronically Signed   By: Genevive Bi M.D.   On: 01/04/2016 13:53     IMPRESSION AND PLAN:   25 year old female with past medical history of GERD who presents to the hospital due to difficulty swallowing and fever and noted to have acute tonsillitis with exudates.  1. Acute tonsillitis with  exudates-this is the cause of patient's difficulty swallowing and fever. -We'll admit the patient and start the patient on IV Decadron, IV Unasyn. Follow clinically. -We'll get ENT consultation.  2. Leukocytosis-secondary to tonsillitis with exudates-follow with IV antibiotic therapy.  3. Hyponatremia-mild and will continue gentle IV fluids.  4. Hypokalemia-Place on oral potassium supplements and repeat level in the morning.    All the records are reviewed and case discussed with ED provider. Management plans discussed with the patient, family and they are in agreement.  CODE STATUS: Full  TOTAL TIME TAKING CARE OF THIS PATIENT: 45 minutes.    Houston Siren M.D on 01/04/2016 at 3:59 PM  Between 7am to 6pm - Pager - [phone]  After 6pm go to [email or web address] - password [email or web address] [facility]  [facility]  Office  [phone]  CC: Primary care physician; No PCP Per Patient

## 2016-01-04 NOTE — Progress Notes (Signed)
Spoke with Dr. Cherlynn KaiserSainani regarding patient's pain level when awake.  Orders received and entered.

## 2016-01-04 NOTE — ED Provider Notes (Signed)
[facility] Emergency Department Provider Note   ____________________________________________    I have reviewed the triage vital signs and the nursing notes.   HISTORY  Chief Complaint Sore Throat     HPI Christine Shields is a 25 y.o. female who presents with complaints of severe sore throat which developed approximately 2 days ago and has worsened. She describes severe sharp pain with swallowing. She denies shortness of breath but reports her throat feels "swollen". She has had fevers and chills at home. No recent sick contacts reported   Past Medical History:  Diagnosis Date  . GERD (gastroesophageal reflux disease)     There are no active problems to display for this patient.   Past Surgical History:  Procedure Laterality Date  . ESOPHAGOGASTRODUODENOSCOPY (EGD) WITH PROPOFOL N/A 07/31/2015   Procedure: ESOPHAGOGASTRODUODENOSCOPY (EGD) WITH PROPOFOL;  Surgeon: Wallace CullensPaul Y Oh, MD;  [facility];  Service: Gastroenterology;  Laterality: N/A;    Prior to Admission medications   Medication Sig Start Date End Date Taking? Authorizing Provider  Alum & Mag Hydroxide-Simeth (GI COCKTAIL) SUSP suspension Take 30 mLs by mouth 2 (two) times daily as needed for indigestion. Shake well. 07/28/15   Minna AntisKevin Paduchowski, MD  ciprofloxacin (CIPRO) 500 MG tablet Take 1 tablet (500 mg total) by mouth 2 (two) times daily. 10/24/15   Irean HongJade J Sung, MD  esomeprazole (NEXIUM) 40 MG capsule Take 40 mg by mouth daily at 12 noon.    Historical Provider, MD  HYDROcodone-acetaminophen (NORCO/VICODIN) 5-325 MG tablet Take 1 tablet by mouth every 4 (four) hours as needed for moderate pain. 07/03/15   Minna AntisKevin Paduchowski, MD  metroNIDAZOLE (FLAGYL) 500 MG tablet Take 1 tablet (500 mg total) by mouth 2 (two) times daily. 10/24/15   Irean HongJade J Sung, MD  ondansetron (ZOFRAN ODT) 4 MG disintegrating tablet Take 1 tablet (4 mg total) by mouth every 8 (eight) hours as needed for nausea  or vomiting. 10/24/15   Irean HongJade J Sung, MD  ondansetron (ZOFRAN) 4 MG tablet Take 1-2 tabs by mouth every 8 hours as needed for nausea/vomiting 07/01/15   Loleta Roseory Forbach, MD  oxyCODONE-acetaminophen (ROXICET) 5-325 MG tablet Take 1 tablet by mouth every 4 (four) hours as needed for severe pain. 10/24/15   Irean HongJade J Sung, MD  pantoprazole (PROTONIX) 40 MG tablet Take 1 tablet (40 mg total) by mouth daily. 07/03/15 07/02/16  Minna AntisKevin Paduchowski, MD  potassium chloride (K-DUR) 10 MEQ tablet Take 1 tablet (10 mEq total) by mouth 2 (two) times daily. 07/28/15   Minna AntisKevin Paduchowski, MD  promethazine (PHENERGAN) 25 MG tablet Take 1 tablet (25 mg total) by mouth every 6 (six) hours as needed for nausea or vomiting. 07/24/15   Minna AntisKevin Paduchowski, MD  sucralfate (CARAFATE) 1 g tablet Take 1 tablet (1 g total) by mouth 4 (four) times daily as needed (for abdominal discomfort, nausea, and/or vomiting). 07/01/15   Loleta Roseory Forbach, MD     Allergies Review of patient's allergies indicates no known allergies.  No family history on file.  Social History Social History  Substance Use Topics  . Smoking status: Current Every Day Smoker    Packs/day: 0.50    Types: Cigarettes  . Smokeless tobacco: Not on file  . Alcohol use Yes    Review of Systems  Constitutional:Positive fever  ENT: As above   Gastrointestinal: No nausea, no vomiting.    Musculoskeletal: Negative for joint pain Skin: Negative for rash. Neurological: Negative for headaches  ____________________________________________   PHYSICAL EXAM:  VITAL SIGNS: ED Triage Vitals  Enc Vitals Group     BP 01/04/16 1257 130/61     Pulse Rate 01/04/16 1257 (!) 102     Resp --      Temp 01/04/16 1257 100 F (37.8 C)     Temp Source 01/04/16 1257 Oral     SpO2 01/04/16 1257 98 %     Weight 01/04/16 1258 170 lb (77.1 kg)     Height 01/04/16 1258 5\' 4"  (1.626 m)     Head Circumference --      Peak Flow --      Pain Score 01/04/16 1258 10     Pain Loc --       Pain Edu? --      Excl. in GC? --     Constitutional: Alert and oriented, uncomfortable but no acute distress Eyes: Conjunctivae are normal.   Nose: No congestion/rhinnorhea. Mouth/Throat: Mucous membranes are moist.  Significant tonsillar swelling and purulent appearing discharge bilaterally, patient has a "hot potato voice", pre-tonsillar swelling on the right suspicious for PTA, uvula is normal Cardiovascular: Mild tachycardia, regular rhythm.  Respiratory: Normal respiratory effort.  No retractions. Genitourinary: deferred  Neurologic:  Normal speech and language. No gross focal neurologic deficits are appreciated.   Skin:  Skin is warm, dry and intact. No rash noted.   ____________________________________________   LABS (all labs ordered are listed, but only abnormal results are displayed)  Labs Reviewed  CBC WITH DIFFERENTIAL/PLATELET  COMPREHENSIVE METABOLIC PANEL   ____________________________________________  EKG   ____________________________________________  RADIOLOGY  Lateral neck x-ray ____________________________________________   PROCEDURES  Procedure(s) performed:No    Critical Care performed: No ____________________________________________   INITIAL IMPRESSION / ASSESSMENT AND PLAN / ED COURSE  Pertinent labs & imaging results that were available during my care of the patient were reviewed by me and considered in my medical decision making (see chart for details).  Patient presents with severe sore throat, tachycardia, fever. Exam is most consistent with PTA, we will obtain lateral neck x-rays, blood work, give Unasyn and Decadron and reevaluate  Discussed with Dr. Jenne Campus of ENT who advises antibiotics and steroids and I will admit the patient overnight  given that she is unable to tolerate by mouth    ____________________________________________   FINAL CLINICAL IMPRESSION(S) / ED DIAGNOSES  Final diagnoses:  Peritonsillar  abscess      NEW MEDICATIONS STARTED DURING THIS VISIT:  New Prescriptions   No medications on file     Note:  This document was prepared using Dragon voice recognition software and may include unintentional dictation errors.    Jene Every, MD 01/04/16 [id number]

## 2016-01-04 NOTE — Progress Notes (Signed)
Pharmacy Antibiotic Note  Christine Shields is a 25 y.o. female admitted on 01/04/2016 with tonsillitis and peritonsillar abscess.  Pharmacy has been consulted for ampicillin/sulbactam dosing.  Plan: Ampicillin/sulbactam 3 g IV q6h  Height: 5\' 4"  (162.6 cm) Weight: 170 lb (77.1 kg) IBW/kg (Calculated) : 54.7  Temp (24hrs), Avg:100 F (37.8 C), Min:100 F (37.8 C), Max:100 F (37.8 C)   Recent Labs Lab 01/04/16 1306  WBC 24.5*  CREATININE 0.80    Estimated Creatinine Clearance: 108.1 mL/min (by C-G formula based on SCr of 0.8 mg/dL).    Allergies  Allergen Reactions  . Promethazine Hcl     Other reaction(s): Other (See Comments) Possible dystonic reaction   Antimicrobials this admission: Ampicillin/sulbactam 9/25 >>   Dose adjustments this admission:  Microbiology results: None  Thank you for allowing pharmacy to be a part of this patient's care.  Cindi CarbonMary M Alamin Mccuiston, PharmD, BCPS Clinical Pharmacist 01/04/2016 3:55 PM

## 2016-01-05 LAB — BASIC METABOLIC PANEL
Anion gap: 7 (ref 5–15)
BUN: 10 mg/dL (ref 6–20)
CHLORIDE: 108 mmol/L (ref 101–111)
CO2: 25 mmol/L (ref 22–32)
Calcium: 8.7 mg/dL — ABNORMAL LOW (ref 8.9–10.3)
Creatinine, Ser: 0.72 mg/dL (ref 0.44–1.00)
Glucose, Bld: 146 mg/dL — ABNORMAL HIGH (ref 65–99)
POTASSIUM: 3.8 mmol/L (ref 3.5–5.1)
SODIUM: 140 mmol/L (ref 135–145)

## 2016-01-05 LAB — MONONUCLEOSIS SCREEN: Mono Screen: NEGATIVE

## 2016-01-05 LAB — CBC
HEMATOCRIT: 43.9 % (ref 35.0–47.0)
Hemoglobin: 14.6 g/dL (ref 12.0–16.0)
MCH: 32.5 pg (ref 26.0–34.0)
MCHC: 33.2 g/dL (ref 32.0–36.0)
MCV: 98.2 fL (ref 80.0–100.0)
PLATELETS: 253 10*3/uL (ref 150–440)
RBC: 4.47 MIL/uL (ref 3.80–5.20)
RDW: 13.9 % (ref 11.5–14.5)
WBC: 20.2 10*3/uL — AB (ref 3.6–11.0)

## 2016-01-05 MED ORDER — KETOROLAC TROMETHAMINE 30 MG/ML IJ SOLN
30.0000 mg | Freq: Four times a day (QID) | INTRAMUSCULAR | Status: DC | PRN
  Administered 2016-01-06: 30 mg via INTRAVENOUS
  Filled 2016-01-05: qty 1

## 2016-01-05 NOTE — Consult Note (Signed)
Christine Shields, Christine Shields [id number] 03/10/1991 Christine Baasadhika Kalisetti, MD  Reason for Consult: Acute tonsillitis  HPI: The patient is a 25 year old African-American female who had worsening sore throat of the last several days. She was noted to have fever of 101. She presented to the emergency room with enlarged red tonsils with exudate. CT scan showed no evidence of abscess. She was started on antibiotics and dose of Decadron and is feeling much better today. She is able to drink liquids okay. She has not had a lot of tonsil problems in past. She, once one episodes of tonsillitis within the last year but otherwise has not had tonsil problems.  Allergies:  Allergies  Allergen Reactions  . Promethazine Hcl     Other reaction(s): Other (See Comments) Possible dystonic reaction    ROS: Review of systems normal other than 12 systems except per HPI.  PMH:  Past Medical History:  Diagnosis Date  . Gastritis   . GERD (gastroesophageal reflux disease)     FH:  Family History  Problem Relation Age of Onset  . Diabetes Neg Hx   . Hypertension Neg Hx     SH:  Social History   Social History  . Marital status: Single    Spouse name: N/A  . Number of children: N/A  . Years of education: N/A   Occupational History  . unemployed    Social History Main Topics  . Smoking status: Current Every Day Smoker    Packs/day: 0.50    Types: Cigarettes  . Smokeless tobacco: Never Used  . Alcohol use Yes  . Drug use: Unknown  . Sexual activity: Not on file   Other Topics Concern  . Not on file   Social History Narrative  . No narrative on file    PSH:  Past Surgical History:  Procedure Laterality Date  . ESOPHAGOGASTRODUODENOSCOPY (EGD) WITH PROPOFOL N/A 07/31/2015   Procedure: ESOPHAGOGASTRODUODENOSCOPY (EGD) WITH PROPOFOL;  Surgeon: Wallace CullensPaul Y Oh, MD;  Location: [facility];  Service: Gastroenterology;  Laterality: N/A;    Physical  Exam: Well-developed and well-nourished in no acute distress.  CN 2-12 grossly intact and symmetric. Oral cavity, lips, gums normal with no masses or lesions. Her oropharynx shows the tonsils to be 4+ a right and 3+ on the left. The red with exudate but no evidence of abscess. The palate is not swollen. Skin warm and dry. Nasal cavity without polyps or purulence. External nose and ears without masses or lesions. Neck supple with no masses or lesions. Some cervical lymphadenopathy palpated but she is not acutely tender. Thyroid normal with no masses.   A/P: Patient has acute exudative tonsillitis that is responding to IV antibiotics and steroids. She can swallow liquids now and can be discharged home tomorrow morning on oral Augmentin for 10 full days of 875 mg twice a day. She can use a prednisone taper from 30 mg to 0 over 6 days. Gargle to help remove the exudate. She will return to see ENT only if she has persisting symptoms that have not completely subsided once the medications are finished.   Eileen Kangas H 01/05/2016 5:55 PM

## 2016-01-05 NOTE — Progress Notes (Signed)
[facility] [place] [facility]   PATIENT NAME: Christine Shields    MR#:  [id number]  DATE OF BIRTH:  12/26/1990  SUBJECTIVE:  CHIEF COMPLAINT:   Chief Complaint  Patient presents with  . Sore Throat   -Admitted for sore throat, noted to have enlarged tonsils with exudates on them. Low-grade fevers last night. -Sore throat is improving  REVIEW OF SYSTEMS:  Review of Systems  Constitutional: Positive for fever. Negative for chills and malaise/fatigue.  HENT: Positive for sore throat. Negative for ear discharge, ear pain and tinnitus.   Eyes: Negative for blurred vision and double vision.  Respiratory: Negative for cough, shortness of breath and wheezing.   Cardiovascular: Negative for chest pain, palpitations and leg swelling.  Gastrointestinal: Negative for abdominal pain, constipation, diarrhea, nausea and vomiting.  Genitourinary: Negative for dysuria and urgency.  Musculoskeletal: Negative for myalgias.  Neurological: Negative for dizziness, sensory change, speech change, focal weakness, seizures and headaches.  Psychiatric/Behavioral: Negative for depression.    DRUG ALLERGIES:   Allergies  Allergen Reactions  . Promethazine Hcl     Other reaction(s): Other (See Comments) Possible dystonic reaction    VITALS:  Blood pressure 114/60, pulse 61, temperature 98.2 F (36.8 C), temperature source Oral, resp. rate 18, height 5\' 4"  (1.626 m), weight 77.1 kg (170 lb), last menstrual period 01/04/2016, SpO2 100 %.  PHYSICAL EXAMINATION:  Physical Exam  GENERAL:  25 y.o.-year-old patient lying in the bed with no acute distress.  EYES: Pupils equal, round, reactive to light and accommodation. No scleral icterus. Extraocular muscles intact.  HEENT: Head atraumatic, normocephalic.nasopharynx clear. White coating on the tongue. Tonsils are enlarged and irregular shaped, erythematous with whitish patches especially on the right tonsil. Palpable submandibular  lymph nodes, nontender NECK:  Supple, no jugular venous distention. No thyroid enlargement, no tenderness.  LUNGS: Normal breath sounds bilaterally, no wheezing, rales,rhonchi or crepitation. No use of accessory muscles of respiration.  CARDIOVASCULAR: S1, S2 normal. No murmurs, rubs, or gallops.  ABDOMEN: Soft, nontender, nondistended. Bowel sounds present. No organomegaly or mass.  EXTREMITIES: No pedal edema, cyanosis, or clubbing.  NEUROLOGIC: Cranial nerves II through XII are intact. Muscle strength 5/5 in all extremities. Sensation intact. Gait not checked.  PSYCHIATRIC: The patient is alert and oriented x 3.  SKIN: No obvious rash, lesion, or ulcer.    LABORATORY PANEL:   CBC  Recent Labs Lab 01/05/16 0430  WBC 20.2*  HGB 14.6  HCT 43.9  PLT 253   ------------------------------------------------------------------------------------------------------------------  Chemistries   Recent Labs Lab 01/04/16 1306 01/05/16 0430  NA 134* 140  K 3.3* 3.8  CL 104 108  CO2 21* 25  GLUCOSE 85 146*  BUN 5* 10  CREATININE 0.80 0.72  CALCIUM 8.7* 8.7*  AST 15  --   ALT 18  --   ALKPHOS 68  --   BILITOT 0.9  --    ------------------------------------------------------------------------------------------------------------------  Cardiac Enzymes No results for input(s): TROPONINI in the last 168 hours. ------------------------------------------------------------------------------------------------------------------  RADIOLOGY:  Dg Neck Soft Tissue  Result Date: 01/04/2016 CLINICAL DATA:  Severe sore throat.  Unable to swallow for 2 days EXAM: NECK SOFT TISSUES - 1+ VIEW COMPARISON:  None. FINDINGS: Posterior oropharynx and hypopharynx appear normal. No prevertebral soft tissue swelling or gas. Prominent lymphoid tissue in the posterior nasopharynx. Epiglottis and glottis appear normal. No foreign body IMPRESSION: 1. Prominent lymphoid tissue in the posterior nasopharynx. 2.  Hypopharynx normal. Electronically Signed   By: Genevive BiStewart  Edmunds  M.D.   On: 01/04/2016 13:53    EKG:   Orders placed or performed during the hospital encounter of 07/28/15  . EKG 12-Lead  . EKG 12-Lead    ASSESSMENT AND PLAN:   25 year old female with past medical history of GERD who presents to the hospital due to difficulty swallowing and fever and noted to have acute tonsillitis with exudates.  1. Acute tonsillitis with exudates-improving now. -Continue Unasyn. Also on decadron. -ENT CONSULT is pending - advance to full liquid diet today -If continues to improve- anticipate discharge tomorrow on oral ABX  2. Leukocytosis-secondary to tonsillitis with exudates-improving  3. Hypokalemia- replaced, improved  4. DVT prophylaxis-on Lovenox   Called and updated mother over the phone.   All the records are reviewed and case discussed with Care Management/Social Workerr. Management plans discussed with the patient, family and they are in agreement.  CODE STATUS: Full code  TOTAL TIME TAKING CARE OF THIS PATIENT: 36 minutes.   POSSIBLE D/C tomorrow, DEPENDING ON CLINICAL CONDITION.   Enid Baas M.D on 01/05/2016 at 1:04 PM  Between 7am to 6pm - Pager - [phone]  After 6pm go to [email or web address] - Social research officer, government  [facility] [place] [facility]  Office  [phone]  CC: Primary care physician; No PCP Per Patient

## 2016-01-05 NOTE — Progress Notes (Signed)
Initial Nutrition Assessment  DOCUMENTATION CODES:   Not applicable  INTERVENTION:  -Cater to pt preferences, diet progression as tolerated  NUTRITION DIAGNOSIS:   Swallowing difficulty related to acute illness as evidenced by per patient/family report.  GOAL:   Patient will meet greater than or equal to 90% of their needs  MONITOR:   PO intake, Labs, Weight trends  REASON FOR ASSESSMENT:   Malnutrition Screening Tool    ASSESSMENT:    25 yo female admitted with acute tonsillitis with exudates with difficulty swallowing and fever  Pt reports difficulty swallowing for 2 days prior to admission with inability to eat anything; prior to this, pt reports good appetite eating regular meals. Swallowing improving, pt tolerated CL diet and diet advanced to FL. Pt requesting italian ice on visit today. Pt unsure if she has lost or gained any weight recently; reports UBW around 170 pounds  Nutrition-Focused physical exam completed. Findings are WDL for fat depletion, muscle depletion, and edema.    Past Medical History:  Diagnosis Date  . Gastritis   . GERD (gastroesophageal reflux disease)      Diet Order:  Diet full liquid Room service appropriate? Yes; Fluid consistency: Thin  Skin:  Reviewed, no issues  Last BM:  9/26   Labs: reviewed  Meds: NS at 100 ml/hr, decadron  Height:   Ht Readings from Last 1 Encounters:  01/04/16 5\' 4"  (1.626 m)    Weight:   Wt Readings from Last 1 Encounters:  01/04/16 170 lb (77.1 kg)    Ideal Body Weight:     BMI:  Body mass index is 29.18 kg/m.  Estimated Nutritional Needs:   Kcal:  1900-2100 kcals  Protein:  60-77 g   Fluid:  >/= 1.8 L  EDUCATION NEEDS:   No education needs identified at this time  Romelle StarcherCate Savino Whisenant MS, RD, LDN [phone] Pager  [phone] Weekend/On-Call Pager

## 2016-01-06 LAB — BASIC METABOLIC PANEL
Anion gap: 7 (ref 5–15)
BUN: 7 mg/dL (ref 6–20)
CALCIUM: 8.4 mg/dL — AB (ref 8.9–10.3)
CO2: 23 mmol/L (ref 22–32)
Chloride: 112 mmol/L — ABNORMAL HIGH (ref 101–111)
Creatinine, Ser: 0.65 mg/dL (ref 0.44–1.00)
GFR calc Af Amer: >60 mL/min (ref >60)
GLUCOSE: 113 mg/dL — AB (ref 65–99)
Potassium: 4 mmol/L (ref 3.5–5.1)
SODIUM: 142 mmol/L (ref 135–145)

## 2016-01-06 LAB — CBC
HCT: 40.4 % (ref 35.0–47.0)
Hemoglobin: 13.9 g/dL (ref 12.0–16.0)
MCH: 32.9 pg (ref 26.0–34.0)
MCHC: 34.5 g/dL (ref 32.0–36.0)
MCV: 95.3 fL (ref 80.0–100.0)
PLATELETS: 240 10*3/uL (ref 150–440)
RBC: 4.23 MIL/uL (ref 3.80–5.20)
RDW: 13.8 % (ref 11.5–14.5)
WBC: 31.9 10*3/uL — AB (ref 3.6–11.0)

## 2016-01-06 MED ORDER — AMOXICILLIN-POT CLAVULANATE 875-125 MG PO TABS: 1.0000 | ORAL_TABLET | Freq: Two times a day (BID) | ORAL | 0 refills | Status: AC

## 2016-01-06 MED ORDER — TRAMADOL HCL 50 MG PO TABS: 50.0000 mg | ORAL_TABLET | Freq: Four times a day (QID) | ORAL | 0 refills | Status: AC | PRN

## 2016-01-06 MED ORDER — PREDNISONE 5 MG (21) PO TBPK: 5.0000 mg | ORAL_TABLET | Freq: Every day | ORAL | 0 refills | Status: AC

## 2016-01-06 NOTE — Discharge Instructions (Signed)
Please do warm water and salt gargles three times a day  Seek help or contact your physician for any increase in swelling or difficulty swallowing, difficulty breathing, fever greater than 100.4, or any other questions or concerns.

## 2016-01-06 NOTE — Discharge Summary (Signed)
[facility] - [place] at [facility]   PATIENT NAME: Christine Shields    MR#:  [id number]  DATE OF BIRTH:  [birth year]  DATE OF ADMISSION:  01/04/2016   ADMITTING PHYSICIAN: Houston Siren, MD  DATE OF DISCHARGE: 01/06/2016  PRIMARY CARE PHYSICIAN: No PCP Per Patient   ADMISSION DIAGNOSIS:   Peritonsillar abscess [J36]  DISCHARGE DIAGNOSIS:   Active Problems:   Tonsillitis with exudate   SECONDARY DIAGNOSIS:   Past Medical History:  Diagnosis Date  . Gastritis   . GERD (gastroesophageal reflux disease)     HOSPITAL COURSE:   25 year old female with past medical history of GERD who presents to the hospital due to difficulty swallowing and fever and noted to have acute tonsillitis with exudates.  1. Acute tonsillitis with exudates-improving now. -received Unasyn. Change to augmentin at discharge -Also on decadron. Change to prednisone taper over 6 days per ENt recommendations -tolerating soft diet well - warm salt water gargles recommended  2. Leukocytosis-secondary to tonsillitis and also from steroids - elevated, but patient is clinically improved and also no further fevers - outpatient f/u recommended  3. Hypokalemia- replaced, improved  Discharge today  DISCHARGE CONDITIONS:   Stable CONSULTS OBTAINED:   Treatment Team:  Linus Salmons, MD  DRUG ALLERGIES:   Allergies  Allergen Reactions  . Promethazine Hcl     Other reaction(s): Other (See Comments) Possible dystonic reaction   DISCHARGE MEDICATIONS:     Medication List    TAKE these medications   amoxicillin-clavulanate 875-125 MG tablet Commonly known as:  AUGMENTIN Take 1 tablet by mouth 2 (two) times daily. X 10 days   esomeprazole 40 MG capsule Commonly known as:  NEXIUM Take 40 mg by mouth daily at 12 noon.   predniSONE 5 MG (21) Tbpk tablet Commonly known as:  STERAPRED UNI-PAK 21 TAB Take 1 tablet (5 mg total) by mouth daily. 6 tabs PO x 1 day 5 tabs PO x  1 day 4 tabs PO x 1 day 3 tabs PO x 1 day 2 tabs PO x 1 day 1 tab PO x 1 day and stop   traMADol 50 MG tablet Commonly known as:  ULTRAM Take 1 tablet (50 mg total) by mouth every 6 (six) hours as needed for moderate pain or severe pain.        DISCHARGE INSTRUCTIONS:   1. ENT f/u in 2 weeks if not improving  DIET:   Regular diet  ACTIVITY:   Activity as tolerated  OXYGEN:   Home Oxygen: No.  Oxygen Delivery: room air  DISCHARGE LOCATION:   home   If you experience worsening of your admission symptoms, develop shortness of breath, life threatening emergency, suicidal or homicidal thoughts you must seek medical attention immediately by calling 911 or calling your MD immediately  if symptoms less severe.  You Must read complete instructions/literature along with all the possible adverse reactions/side effects for all the Medicines you take and that have been prescribed to you. Take any new Medicines after you have completely understood and accpet all the possible adverse reactions/side effects.   Please note  You were cared for by a hospitalist during your hospital stay. If you have any questions about your discharge medications or the care you received while you were in the hospital after you are discharged, you can call the unit and asked to speak with the hospitalist on call if the hospitalist that took care of you is not available. Once you are  discharged, your primary care physician will handle any further medical issues. Please note that NO REFILLS for any discharge medications will be authorized once you are discharged, as it is imperative that you return to your primary care physician (or establish a relationship with a primary care physician if you do not have one) for your aftercare needs so that they can reassess your need for medications and monitor your lab values.    On the day of Discharge:  VITAL SIGNS:   Blood pressure 116/60, pulse 84, temperature 98 F  (36.7 C), temperature source Oral, resp. rate 18, height 5\' 4"  (1.626 m), weight 77.1 kg (170 lb), last menstrual period 01/04/2016, SpO2 99 %.  PHYSICAL EXAMINATION:    GENERAL:  25 y.o.-year-old patient lying in the bed with no acute distress.  EYES: Pupils equal, round, reactive to light and accommodation. No scleral icterus. Extraocular muscles intact.  HEENT: Head atraumatic, normocephalic.nasopharynx clear. White coating on the tongue. Tonsils are enlarged and irregular shaped, erythematous with whitish patches especially on the right tonsil- better than yesterday. Palpable submandibular lymph nodes, nontender NECK:  Supple, no jugular venous distention. No thyroid enlargement, no tenderness.  LUNGS: Normal breath sounds bilaterally, no wheezing, rales,rhonchi or crepitation. No use of accessory muscles of respiration.  CARDIOVASCULAR: S1, S2 normal. No murmurs, rubs, or gallops.  ABDOMEN: Soft, nontender, nondistended. Bowel sounds present. No organomegaly or mass.  EXTREMITIES: No pedal edema, cyanosis, or clubbing.  NEUROLOGIC: Cranial nerves II through XII are intact. Muscle strength 5/5 in all extremities. Sensation intact. Gait not checked.  PSYCHIATRIC: The patient is alert and oriented x 3.  SKIN: No obvious rash, lesion, or ulcer.   DATA REVIEW:   CBC  Recent Labs Lab 01/06/16 0450  WBC 31.9*  HGB 13.9  HCT 40.4  PLT 240    Chemistries   Recent Labs Lab 01/04/16 1306  01/06/16 0450  NA 134*  < > 142  K 3.3*  < > 4.0  CL 104  < > 112*  CO2 21*  < > 23  GLUCOSE 85  < > 113*  BUN 5*  < > 7  CREATININE 0.80  < > 0.65  CALCIUM 8.7*  < > 8.4*  AST 15  --   --   ALT 18  --   --   ALKPHOS 68  --   --   BILITOT 0.9  --   --   < > = values in this interval not displayed.   Microbiology Results  No results found for this or any previous visit.  RADIOLOGY:  No results found.   Management plans discussed with the patient, family and they are in  agreement.  CODE STATUS:     Code Status Orders        Start     Ordered   01/04/16 1705  Full code  Continuous     01/04/16 1704    Code Status History    Date Active Date Inactive Code Status Order ID Comments User Context   This patient has a current code status but no historical code status.      TOTAL TIME TAKING CARE OF THIS PATIENT: 37 minutes.    Enid BaasKALISETTI,Jebidiah Baggerly M.D on 01/06/2016 at 9:47 AM  Between 7am to 6pm - Pager - [phone]  After 6pm go to [email or web address] - Scientist, research (life [email or web address])password [email or web address] [facility]  [facility] [place] [facility]  Office  [phone]  CC: Primary care physician; No PCP Per Patient   Note: This dictation  was prepared with Dragon dictation along with smaller phrase technology. Any transcriptional errors that result from this process are unintentional.

## 2016-05-04 ENCOUNTER — Emergency Department: Payer: Medicaid Other

## 2016-05-04 ENCOUNTER — Encounter: Payer: Self-pay | Admitting: Emergency Medicine

## 2016-05-04 ENCOUNTER — Emergency Department
Admission: EM | Admit: 2016-05-04 | Discharge: 2016-05-04 | Disposition: A | Discharge status: Home / Self Care | Payer: Medicaid Other | Attending: Emergency Medicine | Admitting: Emergency Medicine

## 2016-05-04 DIAGNOSIS — R531 Weakness: Secondary | ICD-10-CM | POA: Diagnosis not present

## 2016-05-04 DIAGNOSIS — F17210 Nicotine dependence, cigarettes, uncomplicated: Secondary | ICD-10-CM | POA: Insufficient documentation

## 2016-05-04 DIAGNOSIS — R112 Nausea with vomiting, unspecified: Secondary | ICD-10-CM

## 2016-05-04 DIAGNOSIS — R200 Anesthesia of skin: Secondary | ICD-10-CM | POA: Insufficient documentation

## 2016-05-04 LAB — PREGNANCY, URINE: PREG TEST UR: NEGATIVE

## 2016-05-04 LAB — COMPREHENSIVE METABOLIC PANEL
ALT: 17 U/L (ref 14–54)
AST: 22 U/L (ref 15–41)
Albumin: 3.9 g/dL (ref 3.5–5.0)
Alkaline Phosphatase: 72 U/L (ref 38–126)
Anion gap: 9 (ref 5–15)
BUN: 9 mg/dL (ref 6–20)
CHLORIDE: 96 mmol/L — AB (ref 101–111)
CO2: 29 mmol/L (ref 22–32)
CREATININE: 1.02 mg/dL — AB (ref 0.44–1.00)
Calcium: 9.1 mg/dL (ref 8.9–10.3)
GFR calc Af Amer: >60 mL/min (ref >60)
GFR calc non Af Amer: >60 mL/min (ref >60)
Glucose, Bld: 100 mg/dL — ABNORMAL HIGH (ref 65–99)
Potassium: 3.2 mmol/L — ABNORMAL LOW (ref 3.5–5.1)
SODIUM: 134 mmol/L — AB (ref 135–145)
Total Bilirubin: 0.6 mg/dL (ref 0.3–1.2)
Total Protein: 8.1 g/dL (ref 6.5–8.1)

## 2016-05-04 LAB — URINALYSIS, COMPLETE (UACMP) WITH MICROSCOPIC
BACTERIA UA: NONE SEEN
Bilirubin Urine: NEGATIVE
Glucose, UA: NEGATIVE mg/dL
Hgb urine dipstick: NEGATIVE
KETONES UR: 20 mg/dL — AB
Nitrite: NEGATIVE
PH: 6 (ref 5.0–8.0)
PROTEIN: 100 mg/dL — AB
Specific Gravity, Urine: 1.027 (ref 1.005–1.030)

## 2016-05-04 LAB — LIPASE, BLOOD: Lipase: 21 U/L (ref 11–51)

## 2016-05-04 LAB — CBC
HCT: 44.7 % (ref 35.0–47.0)
Hemoglobin: 15.3 g/dL (ref 12.0–16.0)
MCH: 32.9 pg (ref 26.0–34.0)
MCHC: 34.1 g/dL (ref 32.0–36.0)
MCV: 96.4 fL (ref 80.0–100.0)
PLATELETS: 287 10*3/uL (ref 150–440)
RBC: 4.64 MIL/uL (ref 3.80–5.20)
RDW: 13.9 % (ref 11.5–14.5)
WBC: 14.5 10*3/uL — ABNORMAL HIGH (ref 3.6–11.0)

## 2016-05-04 MED ORDER — POTASSIUM CHLORIDE CRYS ER 20 MEQ PO TBCR
EXTENDED_RELEASE_TABLET | ORAL | Status: AC
  Administered 2016-05-04: 40 meq via ORAL
  Filled 2016-05-04: qty 2

## 2016-05-04 MED ORDER — POTASSIUM CHLORIDE CRYS ER 20 MEQ PO TBCR
40.0000 meq | EXTENDED_RELEASE_TABLET | Freq: Once | ORAL | Status: AC
  Administered 2016-05-04: 40 meq via ORAL
  Filled 2016-05-04: qty 2

## 2016-05-04 NOTE — Discharge Instructions (Signed)
Please seek medical attention for any high fevers, chest pain, shortness of breath, change in behavior, persistent vomiting, bloody stool or any other new or concerning symptoms.  

## 2016-05-04 NOTE — ED Provider Notes (Signed)
[facility] Emergency Department Provider Note   ____________________________________________   I have reviewed the triage vital signs and the nursing notes.   HISTORY  Chief Complaint Right sided numbness  History limited by: Not Limited   HPI Christine Shields is a 26 y.o. female who presents to the emergency department today because of concern for right sided numbness. The patient states that it is on the right side of her face, her right arm and right leg. The patient states that these symptoms have been going on for the past 2 months. It comes and goes. She apparently has not seen anyone for this, although [facility] and [facility]. She has still felt nauseous but states that is a secondary complaint today. It is unclear what caused her to come to the emergency department today for the complaint of right sided numbness. She could not say that it has changed at all.   Past Medical History:  Diagnosis Date  . Gastritis   . GERD (gastroesophageal reflux disease)     Patient Active Problem List   Diagnosis Date Noted  . Tonsillitis with exudate 01/04/2016    Past Surgical History:  Procedure Laterality Date  . ESOPHAGOGASTRODUODENOSCOPY (EGD) WITH PROPOFOL N/A 07/31/2015   Procedure: ESOPHAGOGASTRODUODENOSCOPY (EGD) WITH PROPOFOL;  Surgeon: Wallace CullensPaul Y Oh, MD;  [facility] ENDOSCOPY;  Service: Gastroenterology;  Laterality: N/A;    Prior to Admission medications   Medication Sig Start Date End Date Taking? Authorizing Provider  esomeprazole (NEXIUM) 40 MG capsule Take 40 mg by mouth daily at 12 noon.    Historical Provider, MD  predniSONE (STERAPRED UNI-PAK 21 TAB) 5 MG (21) TBPK tablet Take 1 tablet (5 mg total) by mouth daily. 6 tabs PO x 1 day 5 tabs PO x 1 day 4 tabs PO x 1 day 3 tabs PO x 1 day 2 tabs PO x 1 day 1 tab PO x 1 day and stop 01/06/16   [name], MD  traMADol (ULTRAM) 50 MG tablet Take 1 tablet (50 mg  total) by mouth every 6 (six) hours as needed for moderate pain or severe pain. 01/06/16   [name], MD    Allergies Promethazine hcl  Family History  Problem Relation Age of Onset  . Diabetes Neg Hx   . Hypertension Neg Hx     Social History Social History  Substance Use Topics  . Smoking status: Current Every Day Smoker    Packs/day: 0.50    Types: Cigarettes  . Smokeless tobacco: Never Used  . Alcohol use Yes     Comment: socially    Review of Systems  Constitutional: Negative for fever. Cardiovascular: Negative for chest pain. Respiratory: Negative for shortness of breath. Gastrointestinal: Positive for nausea and vomiting. Genitourinary: Negative for dysuria. Musculoskeletal: Negative for back pain. Skin: Negative for rash. Neurological: Positive for right sided weakness.   10-point ROS otherwise negative.  ____________________________________________   PHYSICAL EXAM:  VITAL SIGNS: ED Triage Vitals  Enc Vitals Group     BP 05/04/16 1747 91/69     Pulse Rate 05/04/16 1747 (!) 58     Resp 05/04/16 1747 18     Temp 05/04/16 1747 99 F (37.2 C)     Temp src --      SpO2 05/04/16 1747 93 %     Weight 05/04/16 1748 140 lb (63.5 kg)     Height 05/04/16 1748 5\' 6"  (1.676 m)   Constitutional: Alert and  oriented. Well appearing and in no distress. Eyes: Conjunctivae are normal. Normal extraocular movements. ENT   Head: Normocephalic and atraumatic.   Nose: No congestion/rhinnorhea.   Mouth/Throat: Mucous membranes are moist.   Neck: No stridor. Hematological/Lymphatic/Immunilogical: No cervical lymphadenopathy. Cardiovascular: Normal rate, regular rhythm.  No murmurs, rubs, or gallops.  Respiratory: Normal respiratory effort without tachypnea nor retractions. Breath sounds are clear and equal bilaterally. No wheezes/rales/rhonchi. Gastrointestinal: Soft and non tender. No rebound. No guarding.  Genitourinary: Deferred Musculoskeletal:  Normal range of motion in all extremities. No lower extremity edema. Neurologic:  Normal speech and language. Face symmetric. Tongue midline. 4+/5 strength in upper and lower extremities on the right side. 5/5 on the left.  Skin:  Skin is warm, dry and intact. No rash noted. Psychiatric: Mood and affect are normal. Speech and behavior are normal. Patient exhibits appropriate insight and judgment.  ____________________________________________    LABS (pertinent positives/negatives)  Labs Reviewed  COMPREHENSIVE METABOLIC PANEL - Abnormal; Notable for the following:       Result Value   Sodium 134 (*)    Potassium 3.2 (*)    Chloride 96 (*)    Glucose, Bld 100 (*)    Creatinine, Ser 1.02 (*)    All other components within normal limits  CBC - Abnormal; Notable for the following:    WBC 14.5 (*)    All other components within normal limits  URINALYSIS, COMPLETE (UACMP) WITH MICROSCOPIC - Abnormal; Notable for the following:    Color, Urine YELLOW (*)    APPearance HAZY (*)    Ketones, ur 20 (*)    Protein, ur 100 (*)    Leukocytes, UA TRACE (*)    Squamous Epithelial / LPF 6-30 (*)    All other components within normal limits  URINE CULTURE  LIPASE, BLOOD  PREGNANCY, URINE     ____________________________________________   EKG  None  ____________________________________________    RADIOLOGY  CT head   IMPRESSION:  Negative CT of the head.   ____________________________________________   PROCEDURES  Procedures  ____________________________________________   INITIAL IMPRESSION / ASSESSMENT AND PLAN / ED COURSE  Pertinent labs & imaging results that were available during my care of the patient were reviewed by me and considered in my medical decision making (see chart for details).  Patient presented to the emergency department today from primary concern for right-sided weakness. On exam she is very slightly weak on the right side. CT head was negative.  Patient's potassium was a little lone patient was given some potassium orally here. Patient stated that she did feel better after the potassium. This point unclear etiology. The patient does have these waxing and waning symptoms. Will give patient urology follow-up. Patient is seen GI in 2 days for the nausea and vomiting.  ____________________________________________   FINAL CLINICAL IMPRESSION(S) / ED DIAGNOSES  Final diagnoses:  Nausea and vomiting, intractability of vomiting not specified, unspecified vomiting type  Weakness     Note: This dictation was prepared with Dragon dictation. Any transcriptional errors that result from this process are unintentional     Phineas Semen, MD 05/04/16 2136

## 2016-05-04 NOTE — ED Triage Notes (Signed)
Pt comes into the ED via EMS from home c/o N/V/D.  [facility] where they couldn't find anything wrong with the patient.  Patient has a GI appt on Friday.  Patient states that she has now started cramping significantly throughout the day.  Patient presents lethargic.  Patient explains that she isn't able to keep fluids or food down.

## 2016-05-06 LAB — URINE CULTURE

## 2016-11-22 ENCOUNTER — Emergency Department (HOSPITAL_COMMUNITY)
Admission: EM | Admit: 2016-11-22 | Discharge: 2016-11-22 | Disposition: A | Discharge status: Home / Self Care | Payer: Medicaid Other | Attending: Emergency Medicine | Admitting: Emergency Medicine

## 2016-11-22 ENCOUNTER — Emergency Department (HOSPITAL_COMMUNITY): Payer: Medicaid Other

## 2016-11-22 ENCOUNTER — Encounter (HOSPITAL_COMMUNITY): Payer: Self-pay

## 2016-11-22 DIAGNOSIS — Y999 Unspecified external cause status: Secondary | ICD-10-CM | POA: Diagnosis not present

## 2016-11-22 DIAGNOSIS — J029 Acute pharyngitis, unspecified: Secondary | ICD-10-CM | POA: Diagnosis present

## 2016-11-22 DIAGNOSIS — S0990XA Unspecified injury of head, initial encounter: Secondary | ICD-10-CM | POA: Diagnosis not present

## 2016-11-22 DIAGNOSIS — Z79899 Other long term (current) drug therapy: Secondary | ICD-10-CM | POA: Insufficient documentation

## 2016-11-22 DIAGNOSIS — J039 Acute tonsillitis, unspecified: Secondary | ICD-10-CM | POA: Insufficient documentation

## 2016-11-22 DIAGNOSIS — F17210 Nicotine dependence, cigarettes, uncomplicated: Secondary | ICD-10-CM | POA: Diagnosis not present

## 2016-11-22 DIAGNOSIS — W132XXA Fall from, out of or through roof, initial encounter: Secondary | ICD-10-CM | POA: Diagnosis not present

## 2016-11-22 DIAGNOSIS — Y9389 Activity, other specified: Secondary | ICD-10-CM | POA: Diagnosis not present

## 2016-11-22 DIAGNOSIS — Y929 Unspecified place or not applicable: Secondary | ICD-10-CM | POA: Insufficient documentation

## 2016-11-22 DIAGNOSIS — W19XXXA Unspecified fall, initial encounter: Secondary | ICD-10-CM

## 2016-11-22 LAB — CBC WITH DIFFERENTIAL/PLATELET
Basophils Absolute: 0 10*3/uL (ref 0.0–0.1)
Basophils Relative: 0 %
EOS ABS: 0 10*3/uL (ref 0.0–0.7)
EOS PCT: 0 %
HCT: 39.2 % (ref 36.0–46.0)
Hemoglobin: 13.7 g/dL (ref 12.0–15.0)
Lymphocytes Relative: 9 %
Lymphs Abs: 2.5 10*3/uL (ref 0.7–4.0)
MCH: 33.7 pg (ref 26.0–34.0)
MCHC: 34.9 g/dL (ref 30.0–36.0)
MCV: 96.3 fL (ref 78.0–100.0)
MONO ABS: 2.8 10*3/uL — AB (ref 0.1–1.0)
MONOS PCT: 10 %
Neutro Abs: 22.9 10*3/uL — ABNORMAL HIGH (ref 1.7–7.7)
Neutrophils Relative %: 81 %
PLATELETS: 270 10*3/uL (ref 150–400)
RBC: 4.07 MIL/uL (ref 3.87–5.11)
RDW: 13.2 % (ref 11.5–15.5)
WBC: 28.2 10*3/uL — ABNORMAL HIGH (ref 4.0–10.5)

## 2016-11-22 LAB — COMPREHENSIVE METABOLIC PANEL
ALBUMIN: 3.3 g/dL — AB (ref 3.5–5.0)
ALT: 14 U/L (ref 14–54)
AST: 20 U/L (ref 15–41)
Alkaline Phosphatase: 86 U/L (ref 38–126)
Anion gap: 12 (ref 5–15)
BILIRUBIN TOTAL: 1 mg/dL (ref 0.3–1.2)
BUN: 5 mg/dL — AB (ref 6–20)
CO2: 20 mmol/L — ABNORMAL LOW (ref 22–32)
Calcium: 8.7 mg/dL — ABNORMAL LOW (ref 8.9–10.3)
Chloride: 103 mmol/L (ref 101–111)
Creatinine, Ser: 1.04 mg/dL — ABNORMAL HIGH (ref 0.44–1.00)
GFR calc Af Amer: >60 mL/min (ref >60)
GFR calc non Af Amer: >60 mL/min (ref >60)
GLUCOSE: 81 mg/dL (ref 65–99)
POTASSIUM: 4 mmol/L (ref 3.5–5.1)
Sodium: 135 mmol/L (ref 135–145)
TOTAL PROTEIN: 8 g/dL (ref 6.5–8.1)

## 2016-11-22 LAB — URINALYSIS, ROUTINE W REFLEX MICROSCOPIC
Bilirubin Urine: NEGATIVE
Glucose, UA: NEGATIVE mg/dL
HGB URINE DIPSTICK: NEGATIVE
KETONES UR: 20 mg/dL — AB
LEUKOCYTES UA: NEGATIVE
Nitrite: NEGATIVE
PROTEIN: NEGATIVE mg/dL
Specific Gravity, Urine: 1.029 (ref 1.005–1.030)
pH: 7 (ref 5.0–8.0)

## 2016-11-22 LAB — I-STAT BETA HCG BLOOD, ED (MC, WL, AP ONLY): HCG, QUANTITATIVE: 9.7 m[IU]/mL — AB (ref <5)

## 2016-11-22 LAB — RAPID STREP SCREEN (MED CTR MEBANE ONLY): Streptococcus, Group A Screen (Direct): NEGATIVE

## 2016-11-22 LAB — I-STAT CG4 LACTIC ACID, ED: Lactic Acid, Venous: 1.36 mmol/L (ref 0.5–1.9)

## 2016-11-22 LAB — POC URINE PREG, ED: PREG TEST UR: NEGATIVE

## 2016-11-22 MED ORDER — MORPHINE SULFATE (PF) 4 MG/ML IV SOLN
4.0000 mg | Freq: Once | INTRAVENOUS | Status: AC
  Administered 2016-11-22: 4 mg via INTRAVENOUS
  Filled 2016-11-22: qty 1

## 2016-11-22 MED ORDER — DEXAMETHASONE SODIUM PHOSPHATE 10 MG/ML IJ SOLN
10.0000 mg | Freq: Once | INTRAMUSCULAR | Status: AC
  Administered 2016-11-22: 10 mg via INTRAVENOUS
  Filled 2016-11-22: qty 1

## 2016-11-22 MED ORDER — IOPAMIDOL (ISOVUE-300) INJECTION 61%
INTRAVENOUS | Status: AC
  Administered 2016-11-22: 75 mL
  Filled 2016-11-22: qty 75

## 2016-11-22 MED ORDER — SODIUM CHLORIDE 0.9 % IV BOLUS (SEPSIS)
1000.0000 mL | Freq: Once | INTRAVENOUS | Status: AC
  Administered 2016-11-22: 1000 mL via INTRAVENOUS

## 2016-11-22 MED ORDER — AMOXICILLIN 250 MG/5ML PO SUSR: 500.0000 mg | Freq: Two times a day (BID) | ORAL | 0 refills | Status: AC

## 2016-11-22 MED ORDER — SODIUM CHLORIDE 0.9 % IV SOLN
3.0000 g | Freq: Once | INTRAVENOUS | Status: AC
  Administered 2016-11-22: 3 g via INTRAVENOUS
  Filled 2016-11-22: qty 3

## 2016-11-22 MED ORDER — ACETAMINOPHEN 160 MG/5ML PO SOLN
650.0000 mg | Freq: Once | ORAL | Status: AC
  Administered 2016-11-22: 650 mg via ORAL
  Filled 2016-11-22: qty 20.3

## 2016-11-22 NOTE — ED Notes (Signed)
Pt taken to CT.

## 2016-11-22 NOTE — ED Notes (Signed)
Pt back from CT, assisted to bedside commode by this RN. Pt very unsteady on her feet.

## 2016-11-22 NOTE — ED Provider Notes (Signed)
[facility]-EMERGENCY DEPT Provider Note   CSN: [id number] Arrival date & time: 11/22/16  1259     History   Chief Complaint Chief Complaint  Patient presents with  . Fall    HPI Christine Shields is a 26 y.o. female.  HPI  26 year old female with no pertinent past medical history presents to the emergency department for persistent headache and 2 days of throat pain.  Patient reports that she fell from a two-story building, 2 days ago hitting her head on the way down. Patient is unsure of loss of consciousness. Reports that her family member was there but she cannot remember whether she passed out. Reports having a headache since then which has gradually worsened. Patient was not evaluated following the incident. She reports right jaw pain ead and lower back pain following the incident. She denies any lower extremity weakness, urinary or bowel incontinence. Denies any focal weakness, visual deficits, neck pain.   Patient also endorses 2 days of right throat pain and difficulty swallowing. Reports subjective fevers and chills during that time. Denies any sick contacts, URI symptoms, nausea, vomiting, chest pain, shortness of breath, abdominal pain, diarrhea. She denies any dysuria.  Patient reports previous history of strep throat several years ago.  Past Medical History:  Diagnosis Date  . Gastritis   . GERD (gastroesophageal reflux disease)     Patient Active Problem List   Diagnosis Date Noted  . Tonsillitis with exudate 01/04/2016    Past Surgical History:  Procedure Laterality Date  . ESOPHAGOGASTRODUODENOSCOPY (EGD) WITH PROPOFOL N/A 07/31/2015   Procedure: ESOPHAGOGASTRODUODENOSCOPY (EGD) WITH PROPOFOL;  Surgeon: Wallace CullensPaul Y Oh, MD;  [facility];  Service: Gastroenterology;  Laterality: N/A;    OB History    No data available       Home Medications    Prior to Admission medications   Medication Sig Start Date End Date Taking? Authorizing Provider    pantoprazole (PROTONIX) 40 MG tablet Take 40 mg by mouth 2 (two) times daily.    [provider]  predniSONE (STERAPRED UNI-PAK 21 TAB) 5 MG (21) TBPK tablet Take 1 tablet (5 mg total) by mouth daily. 6 tabs PO x 1 day 5 tabs PO x 1 day 4 tabs PO x 1 day 3 tabs PO x 1 day 2 tabs PO x 1 day 1 tab PO x 1 day and stop Patient not taking: Reported on 11/22/2016 01/06/16   Enid BaasKalisetti, Radhika, MD  traMADol (ULTRAM) 50 MG tablet Take 1 tablet (50 mg total) by mouth every 6 (six) hours as needed for moderate pain or severe pain. Patient not taking: Reported on 11/22/2016 01/06/16   Enid BaasKalisetti, Radhika, MD    Family History Family History  Problem Relation Age of Onset  . Diabetes Neg Hx   . Hypertension Neg Hx     Social History Social History  Substance Use Topics  . Smoking status: Current Every Day Smoker    Packs/day: 0.50    Types: Cigarettes  . Smokeless tobacco: Never Used  . Alcohol use Yes     Comment: socially     Allergies   Promethazine hcl   Review of Systems Review of Systems All other systems are reviewed and are negative for acute change except as noted in the HPI   Physical Exam Updated Vital Signs BP (!) 133/107 (BP Location: Left Arm)   Pulse (!) 130   Temp (!) 102.3 F (39.1 C) (Oral)   Resp (!) 24   Ht 5'  4" (1.626 m)   Wt 63.5 kg (140 lb)   SpO2 100%   BMI 24.03 kg/m   Physical Exam  Constitutional: She is oriented to person, place, and time. She appears well-developed and well-nourished. No distress.  HENT:  Head: Normocephalic.  Right Ear: External ear normal.  Left Ear: External ear normal.  Nose: Nose normal.  Mouth/Throat: Posterior oropharyngeal erythema and tonsillar abscesses present. Tonsils are 3+ on the right. Tonsillar exudate.  Eyes: Pupils are equal, round, and reactive to light. Conjunctivae and EOM are normal. Right eye exhibits no discharge. Left eye exhibits no discharge. No scleral icterus.  Neck: Normal range of  motion. Neck supple.    Cardiovascular: Normal rate, regular rhythm and normal heart sounds.  Exam reveals no gallop and no friction rub.   No murmur heard. Pulses:      Radial pulses are 2+ on the right side, and 2+ on the left side.       Dorsalis pedis pulses are 2+ on the right side, and 2+ on the left side.  Pulmonary/Chest: Effort normal and breath sounds normal. No stridor. No respiratory distress. She has no wheezes. She has no rales.  Abdominal: Soft. She exhibits no distension. There is no tenderness.  Musculoskeletal: She exhibits no edema.       Cervical back: She exhibits no bony tenderness.       Thoracic back: She exhibits no bony tenderness.       Lumbar back: She exhibits tenderness and bony tenderness.       Back:  Clavicles stable. Chest stable to AP/Lat compression. Pelvis stable to Lat compression. No obvious extremity deformity. No chest or abdominal wall contusion.  Neurological: She is alert and oriented to person, place, and time.  Spine Exam: Strength: 5/5 throughout LE bilaterally (hip flexion/extension, adduction/abduction; knee flexion/extension; foot dorsiflexion/plantarflexion, inversion/eversion; great toe inversion) Sensation: Intact to light touch in proximal and distal LE bilaterally Reflexes: 1+ quadriceps and achilles reflexes  Skin: Skin is warm and dry. No rash noted. She is not diaphoretic. No erythema.  Psychiatric: She has a normal mood and affect.  Vitals reviewed.    ED Treatments / Results  Labs (all labs ordered are listed, but only abnormal results are displayed) Labs Reviewed  COMPREHENSIVE METABOLIC PANEL - Abnormal; Notable for the following:       Result Value   CO2 20 (*)    BUN 5 (*)    Creatinine, Ser 1.04 (*)    Calcium 8.7 (*)    Albumin 3.3 (*)    All other components within normal limits  CBC WITH DIFFERENTIAL/PLATELET - Abnormal; Notable for the following:    WBC 28.2 (*)    All other components within normal  limits  I-STAT BETA HCG BLOOD, ED ([facility], [facility], [facility] ONLY) - Abnormal; Notable for the following:    I-stat hCG, quantitative 9.7 (*)    All other components within normal limits  RAPID STREP SCREEN (NOT AT [facility])  CULTURE, BLOOD (ROUTINE X 2)  CULTURE, BLOOD (ROUTINE X 2)  CULTURE, GROUP A STREP ([facility])  URINALYSIS, ROUTINE W REFLEX MICROSCOPIC  I-STAT CG4 LACTIC ACID, ED  POC URINE PREG, ED    EKG  EKG Interpretation None       Radiology No results found.  Procedures Procedures (including critical care time)  Medications Ordered in ED Medications  iopamidol (ISOVUE-300) 61 % injection (not administered)  acetaminophen (TYLENOL) solution 650 mg (not administered)  sodium chloride 0.9 % bolus 1,000  mL (1,000 mLs Intravenous New Bag/Given 11/22/16 1452)  dexamethasone (DECADRON) injection 10 mg (10 mg Intravenous Given 11/22/16 1452)  morphine 4 MG/ML injection 4 mg (4 mg Intravenous Given 11/22/16 1452)     Initial Impression / Assessment and Plan / ED Course  I have reviewed the triage vital signs and the nursing notes.  Pertinent labs & imaging results that were available during my care of the patient were reviewed by me and considered in my medical decision making (see chart for details).    1. Fever with sore throat:  Patient is febrile, tachycardic with evidence of pharyngitis that is concerning for peritonsillar/deep tissue abscess. Patient with leukocytosis at 28 We'll need to obtain CT oft tissue of the neck to assess for extension of the infection.   Patient given Decadron, pain medicine, Tylenol. We'll give a single dose of Unasyn while we await imaging.  Lactic acid within normal limits. No evidence of severe sepsis at this time.   2. Fall with head trauma Exam is nonfocal. However given the persistent headache will obtain CT scan of the head and face to rule out intracranial bleed versus facial fractures. Patient without cervical spine tenderness. She does have  lumbar tenderness which will be assessed with plain film. No evidence of cauda equina.   Patient care turned over to Dr Criss Alvine at 1600. Patient case and results discussed in detail; please see their note for further ED managment.      Nira Conn, MD 11/22/16 [phone]

## 2016-11-22 NOTE — ED Notes (Signed)
Patient presents to ed c/o pain in her lower back and right side of her face, states she is also having a headache very tearful. States she fell off a roof 2 days ago , states she was getting some keys.

## 2016-11-22 NOTE — ED Provider Notes (Signed)
CT without acute neck abscess or deep space infection. While her WBC is quite high, it has been this way in the past. Her VS are stable and she appears well. No significant trauma. Treat with oral antibiotics for pharyngitis, f/u with PCP. Discussed return precautions.    Pricilla LovelessGoldston, Abbigale Mcelhaney, MD 11/23/16 [phone]

## 2016-11-22 NOTE — ED Notes (Signed)
Pt taken to xray 

## 2016-11-22 NOTE — ED Notes (Signed)
Pt verbalized understanding of d/c instructions and has no further questions. Pt d/c home with family. VSS

## 2016-11-22 NOTE — ED Notes (Signed)
Radiology notified of negative urine pregnancy test, coming to get pt.

## 2016-11-22 NOTE — ED Triage Notes (Signed)
Per Pt, Pt had children throw her keys on the roof. Pt went to retreive and fell off a one story roof. Pt reports that she hit her right jaw on branch as she fell. Reports neck and back pain. Bilateral arm contraction with BP. Pt is tachypnic and anxious. Unable to fully move jaw. Pt has ambulated after, but complains of pain.

## 2016-11-25 LAB — CULTURE, GROUP A STREP (THRC)

## 2016-11-27 LAB — CULTURE, BLOOD (ROUTINE X 2)
CULTURE: NO GROWTH
Culture: NO GROWTH
SPECIAL REQUESTS: ADEQUATE
Special Requests: ADEQUATE

## 2017-05-25 ENCOUNTER — Encounter: Payer: Self-pay | Admitting: Emergency Medicine

## 2017-05-25 ENCOUNTER — Emergency Department
Admission: EM | Admit: 2017-05-25 | Discharge: 2017-05-25 | Disposition: A | Discharge status: Home / Self Care | Payer: Medicaid Other | Attending: Emergency Medicine | Admitting: Emergency Medicine

## 2017-05-25 ENCOUNTER — Other Ambulatory Visit: Payer: Self-pay

## 2017-05-25 DIAGNOSIS — B9689 Other specified bacterial agents as the cause of diseases classified elsewhere: Secondary | ICD-10-CM

## 2017-05-25 DIAGNOSIS — Z79899 Other long term (current) drug therapy: Secondary | ICD-10-CM | POA: Diagnosis not present

## 2017-05-25 DIAGNOSIS — N760 Acute vaginitis: Secondary | ICD-10-CM | POA: Insufficient documentation

## 2017-05-25 DIAGNOSIS — N730 Acute parametritis and pelvic cellulitis: Secondary | ICD-10-CM

## 2017-05-25 DIAGNOSIS — N739 Female pelvic inflammatory disease, unspecified: Secondary | ICD-10-CM | POA: Diagnosis not present

## 2017-05-25 DIAGNOSIS — R102 Pelvic and perineal pain: Secondary | ICD-10-CM

## 2017-05-25 DIAGNOSIS — R1030 Lower abdominal pain, unspecified: Secondary | ICD-10-CM | POA: Diagnosis present

## 2017-05-25 DIAGNOSIS — F17210 Nicotine dependence, cigarettes, uncomplicated: Secondary | ICD-10-CM | POA: Diagnosis not present

## 2017-05-25 DIAGNOSIS — R1110 Vomiting, unspecified: Secondary | ICD-10-CM | POA: Insufficient documentation

## 2017-05-25 LAB — CBC
HEMATOCRIT: 46 % (ref 35.0–47.0)
HEMOGLOBIN: 15 g/dL (ref 12.0–16.0)
MCH: 33.2 pg (ref 26.0–34.0)
MCHC: 32.6 g/dL (ref 32.0–36.0)
MCV: 101.9 fL — AB (ref 80.0–100.0)
Platelets: 290 10*3/uL (ref 150–440)
RBC: 4.51 MIL/uL (ref 3.80–5.20)
RDW: 13.9 % (ref 11.5–14.5)
WBC: 16.5 10*3/uL — ABNORMAL HIGH (ref 3.6–11.0)

## 2017-05-25 LAB — URINE DRUG SCREEN, QUALITATIVE (ARMC ONLY)
Amphetamines, Ur Screen: NOT DETECTED
Barbiturates, Ur Screen: NOT DETECTED
Benzodiazepine, Ur Scrn: NOT DETECTED
Cannabinoid 50 Ng, Ur ~~LOC~~: POSITIVE — AB
Cocaine Metabolite,Ur ~~LOC~~: NOT DETECTED
MDMA (Ecstasy)Ur Screen: NOT DETECTED
Methadone Scn, Ur: NOT DETECTED
Opiate, Ur Screen: NOT DETECTED
Phencyclidine (PCP) Ur S: NOT DETECTED
TRICYCLIC, UR SCREEN: NOT DETECTED

## 2017-05-25 LAB — COMPREHENSIVE METABOLIC PANEL
ALBUMIN: 3.5 g/dL (ref 3.5–5.0)
ALT: 17 U/L (ref 14–54)
ANION GAP: 10 (ref 5–15)
AST: 23 U/L (ref 15–41)
Alkaline Phosphatase: 56 U/L (ref 38–126)
BUN: 8 mg/dL (ref 6–20)
CHLORIDE: 103 mmol/L (ref 101–111)
CO2: 24 mmol/L (ref 22–32)
Calcium: 8.8 mg/dL — ABNORMAL LOW (ref 8.9–10.3)
Creatinine, Ser: 0.96 mg/dL (ref 0.44–1.00)
GFR calc Af Amer: >60 mL/min (ref >60)
GFR calc non Af Amer: >60 mL/min (ref >60)
Glucose, Bld: 107 mg/dL — ABNORMAL HIGH (ref 65–99)
POTASSIUM: 5.2 mmol/L — AB (ref 3.5–5.1)
SODIUM: 137 mmol/L (ref 135–145)
Total Bilirubin: 1 mg/dL (ref 0.3–1.2)
Total Protein: 7.1 g/dL (ref 6.5–8.1)

## 2017-05-25 LAB — WET PREP, GENITAL
Sperm: NONE SEEN
TRICH WET PREP: NONE SEEN
Yeast Wet Prep HPF POC: NONE SEEN

## 2017-05-25 LAB — URINALYSIS, COMPLETE (UACMP) WITH MICROSCOPIC
BACTERIA UA: NONE SEEN
Bilirubin Urine: NEGATIVE
Glucose, UA: NEGATIVE mg/dL
HGB URINE DIPSTICK: NEGATIVE
Ketones, ur: NEGATIVE mg/dL
NITRITE: NEGATIVE
PROTEIN: NEGATIVE mg/dL
Specific Gravity, Urine: 1.028 (ref 1.005–1.030)
pH: 6 (ref 5.0–8.0)

## 2017-05-25 LAB — CHLAMYDIA/NGC RT PCR (ARMC ONLY)
CHLAMYDIA TR: DETECTED — AB
N gonorrhoeae: NOT DETECTED

## 2017-05-25 LAB — LIPASE, BLOOD: Lipase: 36 U/L (ref 11–51)

## 2017-05-25 LAB — POCT PREGNANCY, URINE: Preg Test, Ur: NEGATIVE

## 2017-05-25 MED ORDER — TRAMADOL HCL 50 MG PO TABS: 50.0000 mg | ORAL_TABLET | Freq: Four times a day (QID) | ORAL | 0 refills | Status: AC | PRN

## 2017-05-25 MED ORDER — OXYCODONE-ACETAMINOPHEN 5-325 MG PO TABS
1.0000 | ORAL_TABLET | Freq: Once | ORAL | Status: AC
Start: 2017-05-25 — End: 2017-05-25
  Administered 2017-05-25: 1 via ORAL
  Filled 2017-05-25: qty 1

## 2017-05-25 MED ORDER — ONDANSETRON 4 MG PO TBDP
4.0000 mg | ORAL_TABLET | Freq: Once | ORAL | Status: AC | PRN
  Administered 2017-05-25: 4 mg via ORAL
  Filled 2017-05-25: qty 1

## 2017-05-25 MED ORDER — LIDOCAINE HCL (PF) 1 % IJ SOLN
INTRAMUSCULAR | Status: AC
Start: 2017-05-25 — End: 2017-05-25
  Administered 2017-05-25: 5 mL
  Filled 2017-05-25: qty 5

## 2017-05-25 MED ORDER — DOXYCYCLINE HYCLATE 100 MG PO CAPS: 100.0000 mg | ORAL_CAPSULE | Freq: Two times a day (BID) | ORAL | 0 refills | Status: AC

## 2017-05-25 MED ORDER — AZITHROMYCIN 500 MG PO TABS
1000.0000 mg | ORAL_TABLET | Freq: Once | ORAL | Status: AC
  Administered 2017-05-25: 1000 mg via ORAL
  Filled 2017-05-25: qty 2

## 2017-05-25 MED ORDER — LORAZEPAM 1 MG PO TABS
1.0000 mg | ORAL_TABLET | Freq: Once | ORAL | Status: AC
  Administered 2017-05-25: 1 mg via ORAL
  Filled 2017-05-25: qty 1

## 2017-05-25 MED ORDER — METRONIDAZOLE 500 MG PO TABS: 500.0000 mg | ORAL_TABLET | Freq: Two times a day (BID) | ORAL | 0 refills | Status: AC

## 2017-05-25 MED ORDER — CEFTRIAXONE SODIUM 250 MG IJ SOLR
250.0000 mg | Freq: Once | INTRAMUSCULAR | Status: AC
  Administered 2017-05-25: 250 mg via INTRAMUSCULAR
  Filled 2017-05-25: qty 250

## 2017-05-25 NOTE — ED Provider Notes (Addendum)
[facility] Emergency Department Provider Note  ____________________________________________   I have reviewed the triage vital signs and the nursing notes. Where available I have reviewed prior notes and, if possible and indicated, outside hospital notes.    HISTORY  Chief Complaint Abdominal Pain and Emesis    HPI Christine Shields is a 27 y.o. female with chronic recurrent abdominal pain which she states is been going on for "years".  Patient is a very poor historian.  States that she has had this recurrent cramping suprapubic discomfort with periods on a regular basis.  Sometimes without her menstrual periods.  She has been seen here twice in the last 3 years for this and had 2- or at least reassuring CT scans with no obvious pathology noted.  She states that she has been having this pain uninterruptedly for 2 weeks.  Nothing makes it better nothing makes it worse no vaginal discharge no dysuria no urinary frequency no hematuria, no melena no bright red blood per rectum no vomiting no diarrhea no hematemesis, no fever no chills, itch "just there".  No radiation.  His suprapubic and a very specific area.  Note she refuses IV.  Has a long history of recurrent abdominal pain and extensive workup she has not been seen for this recently she states.  She does drink alcohol most recently couple days ago, no history of withdrawal she states.  She states she used to drink a lot more.  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 Gastritis   . GERD (gastroesophageal reflux disease)     Patient Active Problem List   Diagnosis Date Noted  . Tonsillitis with exudate 01/04/2016    Past Surgical History:  Procedure Laterality Date  . ESOPHAGOGASTRODUODENOSCOPY (EGD) WITH PROPOFOL N/A 07/31/2015    Procedure: ESOPHAGOGASTRODUODENOSCOPY (EGD) WITH PROPOFOL;  Surgeon: Wallace Cullens, MD;  Location: [facility] ENDOSCOPY;  Service: Gastroenterology;  Laterality: N/A;    Prior to Admission medications   Medication Sig Start Date End Date Taking? Authorizing Provider  amoxicillin (AMOXIL) 250 MG/5ML suspension Take 10 mLs (500 mg total) by mouth 2 (two) times daily. X 10 days 11/22/16   Pricilla Loveless, MD  pantoprazole (PROTONIX) 40 MG tablet Take 40 mg by mouth 2 (two) times daily.    [provider]  predniSONE (STERAPRED UNI-PAK 21 TAB) 5 MG (21) TBPK tablet Take 1 tablet (5 mg total) by mouth daily. 6 tabs PO x 1 day 5 tabs PO x 1 day 4 tabs PO x 1 day 3 tabs PO x 1 day 2 tabs PO x 1 day 1 tab PO x 1 day and stop Patient not taking: Reported on 11/22/2016 01/06/16   Enid Baas, MD  traMADol (ULTRAM) 50 MG tablet Take 1 tablet (50 mg total) by mouth every 6 (six) hours as needed for moderate pain or severe pain. Patient not taking: Reported on 11/22/2016 01/06/16   Enid Baas, MD    Allergies Promethazine hcl  Family History  Problem Relation Age of Onset  . Diabetes Neg Hx   . Hypertension Neg Hx     Social History Social History   Tobacco Use  . Smoking status: Current Every Day Smoker    Packs/day: 0.50    Types: Cigarettes  . Smokeless  tobacco: Never Used  Substance Use Topics  . Alcohol use: Yes    Comment: socially  . Drug use: Yes    Types: Marijuana    Comment: last use 05/01/16    Review of Systems Constitutional: No fever/chills Eyes: No visual changes. ENT: No sore throat. No stiff neck no neck pain Cardiovascular: Denies chest pain. Respiratory: Denies shortness of breath. Gastrointestinal:   no vomiting.  No diarrhea.  No constipation. Genitourinary: Negative for dysuria. Musculoskeletal: Negative lower extremity swelling Skin: Negative for rash. Neurological: Negative for severe headaches, focal weakness or  numbness.   ____________________________________________   PHYSICAL EXAM:  VITAL SIGNS: ED Triage Vitals  Enc Vitals Group     BP 05/25/17 1744 128/60     Pulse Rate 05/25/17 1744 73     Resp 05/25/17 1744 20     Temp 05/25/17 1744 98.2 F (36.8 C)     Temp Source 05/25/17 1744 Oral     SpO2 05/25/17 1744 99 %     Weight 05/25/17 1745 145 lb (65.8 kg)     Height 05/25/17 1745 5\' 4"  (1.626 m)     Head Circumference --      Peak Flow --      Pain Score 05/25/17 1744 10     Pain Loc --      Pain Edu? --      Excl. in GC? --     Constitutional: Alert and oriented. Well appearing and in no acute distress.  Anxious upset and tearful Eyes: Conjunctivae are normal Head: Atraumatic HEENT: No congestion/rhinnorhea. Mucous membranes are moist.  Oropharynx non-erythematous Neck:   Nontender with no meningismus, no masses, no stridor Cardiovascular: Normal rate, regular rhythm. Grossly normal heart sounds.  Good peripheral circulation. Respiratory: Normal respiratory effort.  No retractions. Lungs CTAB. Abdominal: Soft and positive suprapubic tenderness. No distention. No guarding no rebound Back:  There is no focal tenderness or step off.  there is no midline tenderness there are no lesions noted. there is no CVA tenderness   Musculoskeletal: No lower extremity tenderness, no upper extremity tenderness. No joint effusions, no DVT signs strong distal pulses no edema Neurologic:  Normal speech and language. No gross focal neurologic deficits are appreciated.  Skin:  Skin is warm, dry and intact. No rash noted. Psychiatric: Mood and affect are she is very anxious and upset and tearful. Speech and behavior are normal.  ____________________________________________   LABS (all labs ordered are listed, but only abnormal results are displayed)  Labs Reviewed  COMPREHENSIVE METABOLIC PANEL - Abnormal; Notable for the following components:      Result Value   Potassium 5.2 (*)     Glucose, Bld 107 (*)    Calcium 8.8 (*)    All other components within normal limits  CBC - Abnormal; Notable for the following components:   WBC 16.5 (*)    MCV 101.9 (*)    All other components within normal limits  CHLAMYDIA/NGC RT PCR ([facility] ONLY)  WET PREP, GENITAL  LIPASE, BLOOD  URINALYSIS, COMPLETE (UACMP) WITH MICROSCOPIC  URINE DRUG SCREEN, QUALITATIVE ([facility] ONLY)  POC URINE PREG, ED    Pertinent labs  results that were available during my care of the patient were reviewed by me and considered in my medical decision making (see chart for details). ____________________________________________  EKG  I personally interpreted any EKGs ordered by me or triage  ____________________________________________  RADIOLOGY  Pertinent labs & imaging results that were available during my care  of the patient were reviewed by me and considered in my medical decision making (see chart for details). If possible, patient and/or family made aware of any abnormal findings.  No results found. ____________________________________________    PROCEDURES  Procedure(s) performed: None  Procedures  Critical Care performed: None  ____________________________________________   INITIAL IMPRESSION / ASSESSMENT AND PLAN / ED COURSE  Pertinent labs & imaging results that were available during my care of the patient were reviewed by me and considered in my medical decision making (see chart for details). Patient here with suprapubic pain of years duration intermittently, last muscle.  Was 2 weeks ago denies pregnancy, abdomen nonsurgical, extensive prior workups for similar including several CT scans, we will obtain pelvic exam to rule out PID.  Patient very anxious and upset.  She is jittery in the room.  We will give her Ativan and oral Percocet for her pain.  She refuses IV pain medication.  Abdomen is benign.  White count is somewhat elevated but this is chronic for her.  Potassium  is hemolyzed normal kidney function no indication of the need for redrawn patient refuses anyway.  Urinalysis is pending.  We will also send urine drug screen.  We will do pelvic exam to rule out PID or TOA and will reassess.  Differential also includes ovarian cyst, appendicitis but given chronicity of the pain I have low suspicion for all these entities.  ----------------------------------------- 7:26 PM on 05/25/2017 -----------------------------------------  Pelvic exam: Female nurse chaperone present, no external lesions noted, slightly yellowish vaginal discharge noted with no purulent discharge, no+  cervical motion tenderness, no adnexal tenderness or mass, there is no significant uterine tenderness or mass. No vaginal bleeding  Patient and I discussed whether an ultrasound was indicated she has had this pain for a long time and she would prefer not to have it done today.  After Ativan and Percocet she fell asleep and feels much better, she is no longer anxious and she feels very relaxed and her pain is well controlled.  Patient may have PID, no evidence however of TOA.  I did offer her an ultrasound and again she declines.  We will treat her for possible PID, and we will have her closely follow-up with OB/GYN, return precautions and follow-up given and understood.  Abdomen is completely benign at this time no evidence of appendicitis Considering the patient's symptoms, medical history, and physical examination today, I have low suspicion for cholecystitis or biliary pathology, pancreatitis, perforation or bowel obstruction, hernia, intra-abdominal abscess, AAA or dissection, volvulus or intussusception, mesenteric ischemia, ischemic gut, pyelonephritis or appendicitis.    ____________________________________________   FINAL CLINICAL IMPRESSION(S) / ED DIAGNOSES  Final diagnoses:  None      This chart was dictated using voice recognition software.  Despite best efforts to proofread,   errors can occur which can change meaning.      Jeanmarie Plant, MD 05/25/17 Ayesha Mohair    Jeanmarie Plant, MD 05/25/17 [phone]

## 2017-05-25 NOTE — Discharge Instructions (Signed)
Pain in your vagina could be caused by an infection.  We do not know for sure if you have an STD however without the culture results.  Nonetheless we are giving you antibiotics as a precaution.  He would prefer not to have a ultrasound at this time which is not unreasonable but if you have increased pain, fever, vomiting or you feel worse in any way please return to the emergency department.  We have advised you to go without sexual intercourse until all partners are treated.  Please follow closely with OB/GYN to try to get to the bottom of this chronic pain.

## 2017-05-25 NOTE — ED Triage Notes (Signed)
Pt presents to ED with c/o abdominal pain and emesis. Pt states pain is in lower abdomen in her pelvic area. Pt states pain has been going on "for a while" but has not had it checked. States pain is intermittent. Pt states "going to sleep" makes the pain better. Pt states emesis has been intermittent for the same amount of time. Pt denies urinary symptoms, but c/o lower back pain.

## 2017-05-26 ENCOUNTER — Telehealth: Payer: Self-pay | Admitting: Emergency Medicine

## 2017-05-26 NOTE — Telephone Encounter (Addendum)
Called patient to give std culture results. She was treated during her visit.  Her number says not in service.  Will send letter.  2/27--patient called me back.  I gave her result and instructed on partner treatment at [facility].

## 2017-06-13 ENCOUNTER — Emergency Department: Payer: Medicaid Other

## 2017-06-13 ENCOUNTER — Emergency Department
Admission: EM | Admit: 2017-06-13 | Discharge: 2017-06-13 | Disposition: A | Discharge status: Home / Self Care | Payer: Medicaid Other | Attending: Emergency Medicine | Admitting: Emergency Medicine

## 2017-06-13 ENCOUNTER — Encounter: Payer: Self-pay | Admitting: Emergency Medicine

## 2017-06-13 DIAGNOSIS — Y998 Other external cause status: Secondary | ICD-10-CM | POA: Insufficient documentation

## 2017-06-13 DIAGNOSIS — W01198A Fall on same level from slipping, tripping and stumbling with subsequent striking against other object, initial encounter: Secondary | ICD-10-CM | POA: Insufficient documentation

## 2017-06-13 DIAGNOSIS — F17210 Nicotine dependence, cigarettes, uncomplicated: Secondary | ICD-10-CM | POA: Diagnosis not present

## 2017-06-13 DIAGNOSIS — S060X9A Concussion with loss of consciousness of unspecified duration, initial encounter: Secondary | ICD-10-CM | POA: Diagnosis not present

## 2017-06-13 DIAGNOSIS — Y9389 Activity, other specified: Secondary | ICD-10-CM | POA: Insufficient documentation

## 2017-06-13 DIAGNOSIS — W19XXXA Unspecified fall, initial encounter: Secondary | ICD-10-CM

## 2017-06-13 DIAGNOSIS — S0990XA Unspecified injury of head, initial encounter: Secondary | ICD-10-CM

## 2017-06-13 DIAGNOSIS — Y929 Unspecified place or not applicable: Secondary | ICD-10-CM | POA: Diagnosis not present

## 2017-06-13 DIAGNOSIS — Z79899 Other long term (current) drug therapy: Secondary | ICD-10-CM | POA: Diagnosis not present

## 2017-06-13 LAB — COMPREHENSIVE METABOLIC PANEL
ALK PHOS: 59 U/L (ref 38–126)
ALT: 23 U/L (ref 14–54)
AST: 23 U/L (ref 15–41)
Albumin: 3.4 g/dL — ABNORMAL LOW (ref 3.5–5.0)
Anion gap: 5 (ref 5–15)
BUN: 9 mg/dL (ref 6–20)
CALCIUM: 8.9 mg/dL (ref 8.9–10.3)
CHLORIDE: 107 mmol/L (ref 101–111)
CO2: 26 mmol/L (ref 22–32)
Creatinine, Ser: 0.88 mg/dL (ref 0.44–1.00)
GFR calc non Af Amer: >60 mL/min (ref >60)
Glucose, Bld: 82 mg/dL (ref 65–99)
Potassium: 3.9 mmol/L (ref 3.5–5.1)
SODIUM: 138 mmol/L (ref 135–145)
Total Bilirubin: 0.7 mg/dL (ref 0.3–1.2)
Total Protein: 6.9 g/dL (ref 6.5–8.1)

## 2017-06-13 LAB — CBC
HCT: 40.9 % (ref 35.0–47.0)
HEMOGLOBIN: 13.7 g/dL (ref 12.0–16.0)
MCH: 33.3 pg (ref 26.0–34.0)
MCHC: 33.5 g/dL (ref 32.0–36.0)
MCV: 99.3 fL (ref 80.0–100.0)
Platelets: 305 10*3/uL (ref 150–440)
RBC: 4.12 MIL/uL (ref 3.80–5.20)
RDW: 13.6 % (ref 11.5–14.5)
WBC: 11.3 10*3/uL — AB (ref 3.6–11.0)

## 2017-06-13 LAB — URINALYSIS, ROUTINE W REFLEX MICROSCOPIC
Bacteria, UA: NONE SEEN
Bilirubin Urine: NEGATIVE
GLUCOSE, UA: NEGATIVE mg/dL
Hgb urine dipstick: NEGATIVE
KETONES UR: NEGATIVE mg/dL
Leukocytes, UA: NEGATIVE
Nitrite: NEGATIVE
PROTEIN: NEGATIVE mg/dL
Specific Gravity, Urine: 1.015 (ref 1.005–1.030)
pH: 6 (ref 5.0–8.0)

## 2017-06-13 LAB — TROPONIN I: Troponin I: <0.03 ng/mL (ref <0.03)

## 2017-06-13 LAB — POCT PREGNANCY, URINE: Preg Test, Ur: NEGATIVE

## 2017-06-13 MED ORDER — KETOROLAC TROMETHAMINE 30 MG/ML IJ SOLN
15.0000 mg | Freq: Once | INTRAMUSCULAR | Status: AC
  Administered 2017-06-13: 15 mg via INTRAVENOUS
  Filled 2017-06-13: qty 1

## 2017-06-13 MED ORDER — METOCLOPRAMIDE HCL 5 MG/ML IJ SOLN
10.0000 mg | Freq: Once | INTRAMUSCULAR | Status: AC
  Administered 2017-06-13: 10 mg via INTRAVENOUS
  Filled 2017-06-13: qty 2

## 2017-06-13 MED ORDER — BUTALBITAL-APAP-CAFFEINE 50-325-40 MG PO TABS: 1.0000 | ORAL_TABLET | Freq: Four times a day (QID) | ORAL | 0 refills | Status: AC | PRN

## 2017-06-13 MED ORDER — SODIUM CHLORIDE 0.9 % IV BOLUS (SEPSIS)
1000.0000 mL | Freq: Once | INTRAVENOUS | Status: AC
Start: 2017-06-13 — End: 2017-06-13
  Administered 2017-06-13: 1000 mL via INTRAVENOUS

## 2017-06-13 MED ORDER — DIPHENHYDRAMINE HCL 50 MG/ML IJ SOLN
50.0000 mg | Freq: Once | INTRAMUSCULAR | Status: AC
  Administered 2017-06-13: 25 mg via INTRAVENOUS
  Filled 2017-06-13: qty 1

## 2017-06-13 NOTE — ED Notes (Signed)
Sandwich tray given at this time 

## 2017-06-13 NOTE — ED Triage Notes (Signed)
Pt to ED referred from Phineas Realharles Drew. Pt fell yesterday and hit the back of her head. Pt's fall was witnessed. Since the fall pt has had AMS. Pt states throbbing pain that shoots across her forehead.

## 2017-06-13 NOTE — ED Notes (Signed)
Patient declined discharge vital signs. 

## 2017-06-13 NOTE — ED Provider Notes (Signed)
-----------------------------------------   5:29 PM on 06/13/2017 -----------------------------------------   Blood pressure (!) 95/54, pulse (!) 56, resp. rate 16, last menstrual period 06/10/2017, SpO2 99 %.  Assuming care from Dr. Sheela StackPaduchowsky of Christine Shields is a 27 y.o. female with a chief complaint of Fall .    Please refer to H&P by previous MD for further details.  The current plan of care is to f/u results of labs and UA and reassess.  Labs WNL. Patient feels markedly improved. Discussed post-concussive care with patient. Patient will be dc at this time.        Don PerkingVeronese, [place], MD 06/13/17 [phone]

## 2017-06-13 NOTE — ED Provider Notes (Signed)
[facility] Emergency Department Provider Note  Time seen: 1:44 PM  I have reviewed the triage vital signs and the nursing notes.   HISTORY  Chief Complaint Fall    HPI Christine Shields is a 27 y.o. female with a past medical history of gastric reflux, gastritis, presents to the emergency department after a fall with a head injury.  According to the patient yesterday around 6:30 PM she was on the phone when she fell backwards hitting her head on the ground possible loss of consciousness.  Patient states she has been feeling dizzy she does not know she was dizzy before the fall but has been dizzy with a headache and nausea since the fall.  Vomited one time this morning.  Patient states she has been very tired feeling but unable to sleep.  Family members here with the patient states she has been very tired acting today so they brought her to the emergency department for evaluation.  Patient denies any focal weakness or numbness.  Denies any current nausea.  Denies any recent cough, congestion or fever.   Past Medical History:  Diagnosis Date  . Gastritis   . GERD (gastroesophageal reflux disease)     Patient Active Problem List   Diagnosis Date Noted  . Tonsillitis with exudate 01/04/2016    Past Surgical History:  Procedure Laterality Date  . ESOPHAGOGASTRODUODENOSCOPY (EGD) WITH PROPOFOL N/A 07/31/2015   Procedure: ESOPHAGOGASTRODUODENOSCOPY (EGD) WITH PROPOFOL;  Surgeon: Wallace Cullens, MD;  Location: [facility] ENDOSCOPY;  Service: Gastroenterology;  Laterality: N/A;    Prior to Admission medications   Medication Sig Start Date End Date Taking? Authorizing Provider  amoxicillin (AMOXIL) 250 MG/5ML suspension Take 10 mLs (500 mg total) by mouth 2 (two) times daily. X 10 days 11/22/16   Pricilla Loveless, MD  doxycycline (VIBRAMYCIN) 100 MG capsule Take 1 capsule (100 mg total) by mouth 2 (two) times daily. 05/25/17   Jeanmarie Plant, MD  pantoprazole (PROTONIX) 40 MG  tablet Take 40 mg by mouth 2 (two) times daily.    [provider]  predniSONE (STERAPRED UNI-PAK 21 TAB) 5 MG (21) TBPK tablet Take 1 tablet (5 mg total) by mouth daily. 6 tabs PO x 1 day 5 tabs PO x 1 day 4 tabs PO x 1 day 3 tabs PO x 1 day 2 tabs PO x 1 day 1 tab PO x 1 day and stop Patient not taking: Reported on 11/22/2016 01/06/16   Enid Baas, MD  traMADol (ULTRAM) 50 MG tablet Take 1 tablet (50 mg total) by mouth every 6 (six) hours as needed for moderate pain or severe pain. Patient not taking: Reported on 11/22/2016 01/06/16   Enid Baas, MD  traMADol (ULTRAM) 50 MG tablet Take 1 tablet (50 mg total) by mouth every 6 (six) hours as needed. 05/25/17 05/25/18  Jeanmarie Plant, MD    Allergies  Allergen Reactions  . Promethazine Hcl     Other reaction(s): Other (See Comments) Possible dystonic reaction    Family History  Problem Relation Age of Onset  . Diabetes Neg Hx   . Hypertension Neg Hx     Social History Social History   Tobacco Use  . Smoking status: Current Every Day Smoker    Packs/day: 0.50    Types: Cigarettes  . Smokeless tobacco: Never Used  Substance Use Topics  . Alcohol use: Yes    Comment: socially  . Drug use: Yes    Types: Marijuana  Comment: last use 05/01/16    Review of Systems Constitutional: Negative for fever. Eyes: Negative for visual complaints ENT: Negative for recent illness/congestion Cardiovascular: Negative for chest pain. Respiratory: Negative for shortness of breath. Gastrointestinal: Negative for abdominal pain.  One episode of vomiting this morning. Genitourinary: Negative for urinary compaints Musculoskeletal: Negative for musculoskeletal complaints Skin: Negative for skin complaints  Neurological: Significant headache.  Denies weakness or numbness. All other ROS negative  ____________________________________________   PHYSICAL EXAM:  Constitutional: Patient is somnolent, will awaken and  answer questions appropriately follows commands, but then closes her eyes and says she wants to go back to sleep.  Eyes: Normal exam ENT   Head: Normocephalic and atraumatic.   Mouth/Throat: Mucous membranes are moist. Cardiovascular: Normal rate, regular rhythm. No murmur Respiratory: Normal respiratory effort without tachypnea nor retractions. Breath sounds are clear Gastrointestinal: Soft and nontender. No distention.   Musculoskeletal: Nontender with normal range of motion in all extremities. No lower extremity tenderness or edema. Neurologic:  Normal speech and language. No gross focal neurologic deficits moves all extremities well. Skin:  Skin is warm, dry and intact.  Psychiatric: Mood and affect are normal. Speech and behavior are normal.   ____________________________________________    EKG  EKG reviewed and interpreted by myself shows normal sinus rhythm  ____________________________________________    RADIOLOGY  CT head negative  ____________________________________________   INITIAL IMPRESSION / ASSESSMENT AND PLAN / ED COURSE  Pertinent labs & imaging results that were available during my care of the patient were reviewed by me and considered in my medical decision making (see chart for details).  Patient presents emergency department after head injury yesterday, now with increased fatigue tiredness today.  [facility] and was referred to the emergency department for a CT scan and further evaluation.  Currently patient overall appears well but is somnolent, awakens to voice will answer questions appropriately and follow commands but then closes her eyes and so she just wants to go back to sleep.  Differential at this time would include concussion, intracranial abnormality such as ICH or mass.  Given the patient's generalized fatigue weakness we will check labs, CT scan of the head, treat the patient's headache with Toradol, Reglan,  Benadryl, IV fluids and continue to closely monitor.  CT scan of the head is negative.  CBC is within normal limits, chemistry, troponin and urinalysis pending at this time.  Patient care signed out to oncoming physician.  ____________________________________________   FINAL CLINICAL IMPRESSION(S) / ED DIAGNOSES  Head injury Concussion    Minna AntisPaduchowski, Liev [place], MD 06/13/17 [phone]

## 2017-06-18 DIAGNOSIS — R51 Headache: Secondary | ICD-10-CM | POA: Diagnosis not present

## 2019-03-14 ENCOUNTER — Ambulatory Visit: Payer: Self-pay | Admitting: Family Medicine

## 2019-03-14 ENCOUNTER — Other Ambulatory Visit: Payer: Self-pay

## 2019-07-18 IMAGING — CT CT MAXILLOFACIAL W/O CM
3 series · 16 of 47 positions shown, 19 images · non-contrast
Comparison: None.

CLINICAL DATA: Fell off of a roof.

EXAM:
CT HEAD WITHOUT CONTRAST
CT MAXILLOFACIAL WITHOUT CONTRAST
TECHNIQUE: Multidetector CT imaging of the head and maxillofacial structures
were performed using the standard protocol without intravenous
contrast. Multiplanar CT image reconstructions of the maxillofacial
structures were also generated.

[Series 3: facialbone 2.0 st · axial · 0.36mm/px · z∈[-306,-166]mm · 10 of 82 slices shown, 13 images]
[im 6/82  brain]
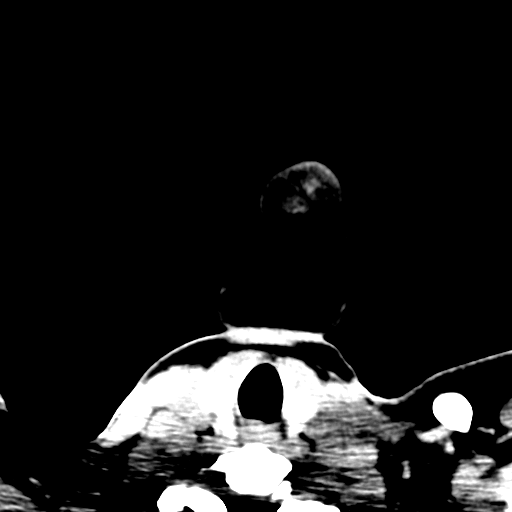
[im 6/82  bone]
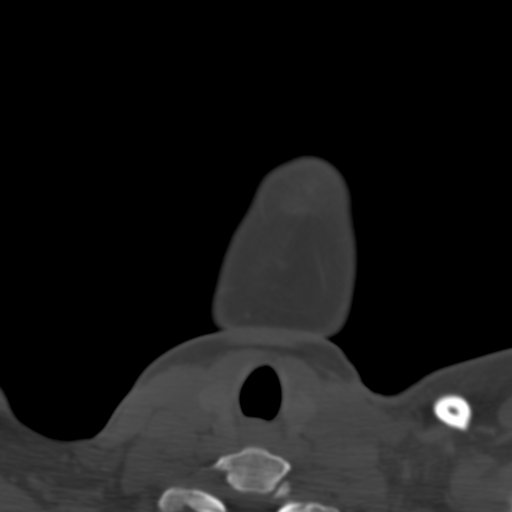
[im 14/82  bone]
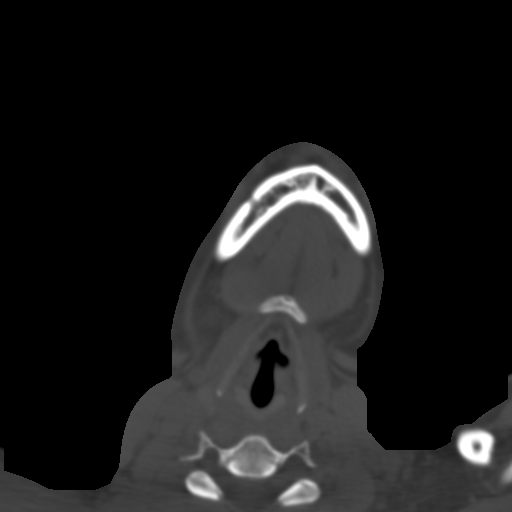
[im 23/82  bone]
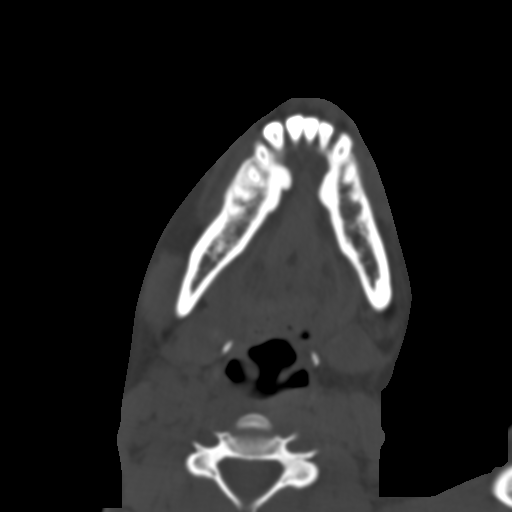
[im 28/82  bone]
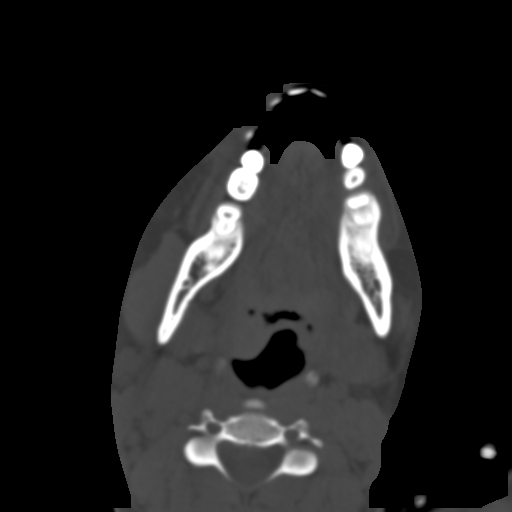
[im 37/82  brain]
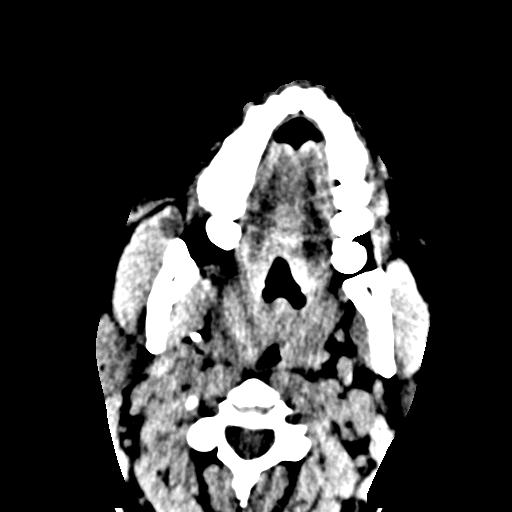
[im 37/82  bone]
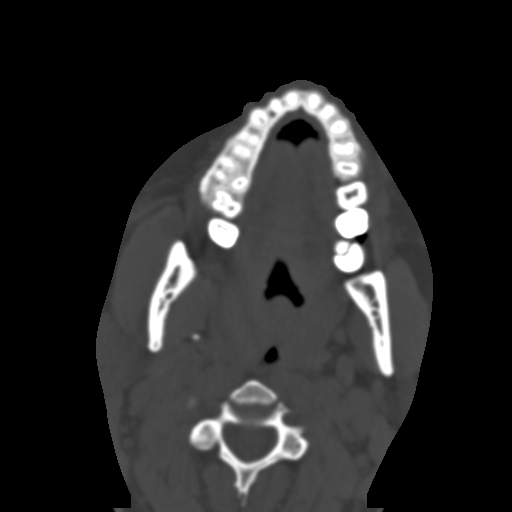
[im 45/82  bone]
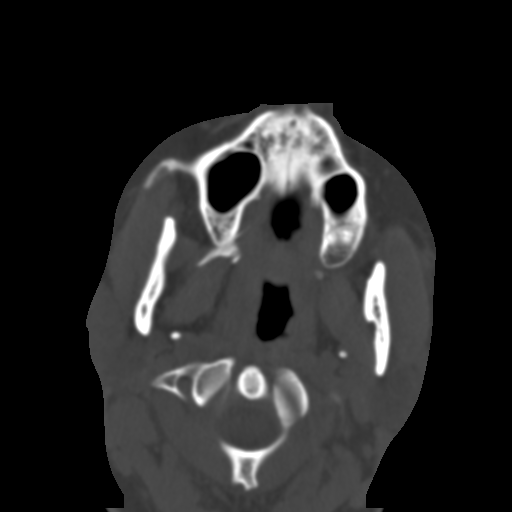
[im 54/82  bone]
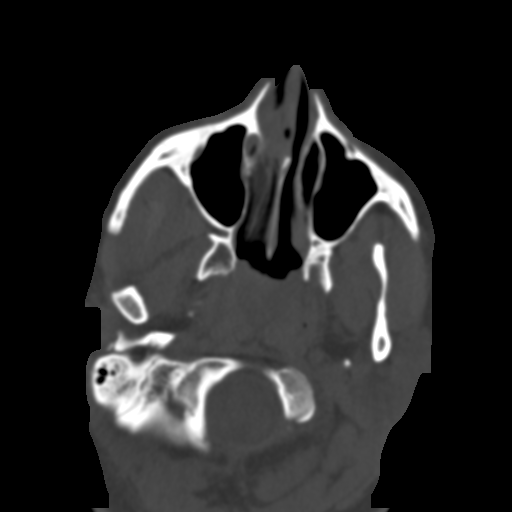
[im 62/82  bone]
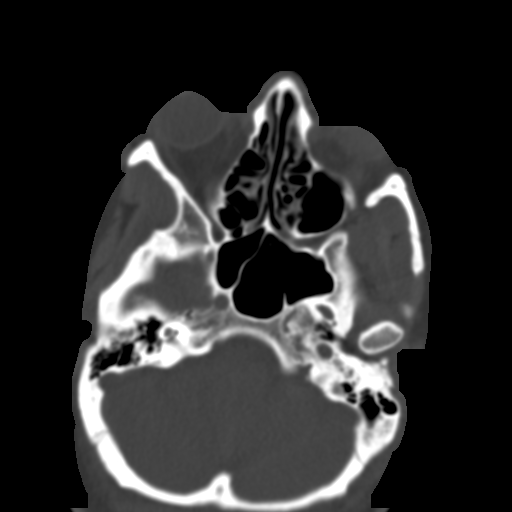
[im 68/82  brain]
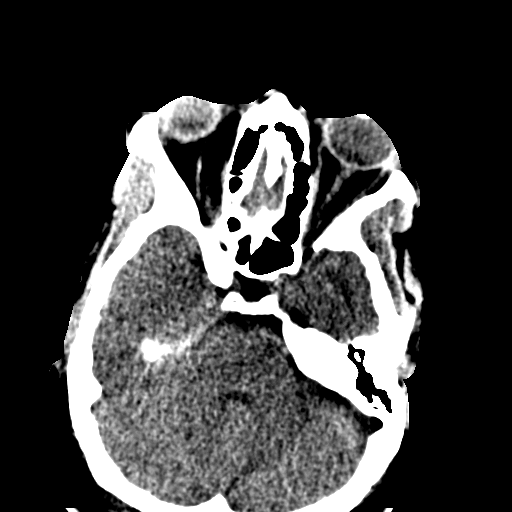
[im 68/82  bone]
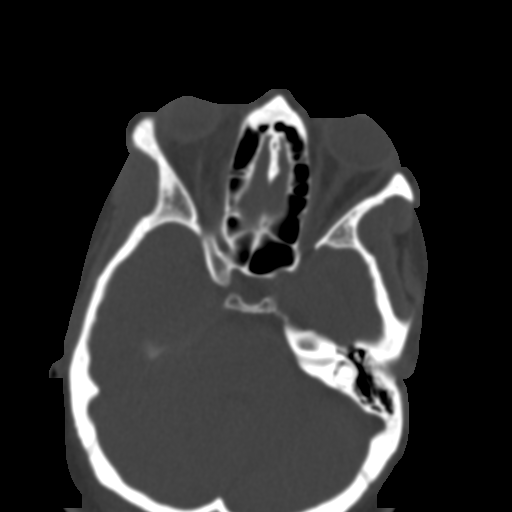
[im 76/82  bone]
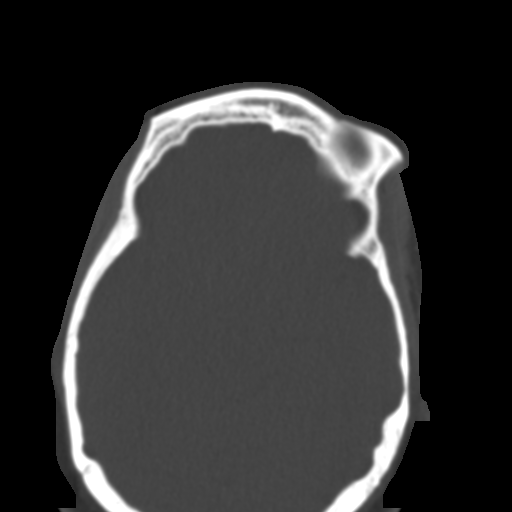

[Series 9: facialbone 2.0 cor st · coronal · 0.32mm/px · 3 of 80 slices shown]
[im 27/80  bone]
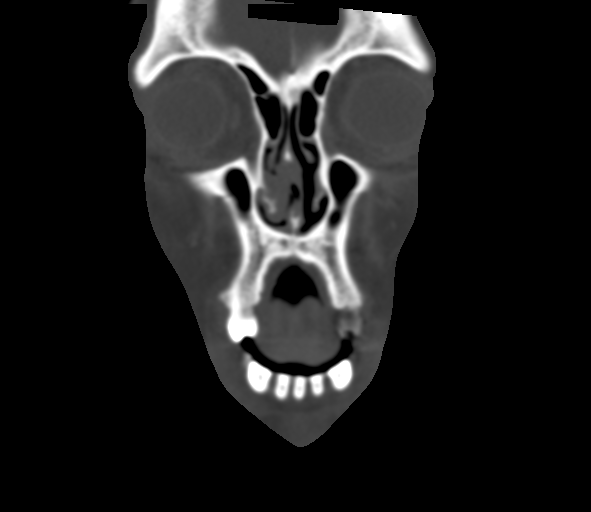
[im 36/80  bone]
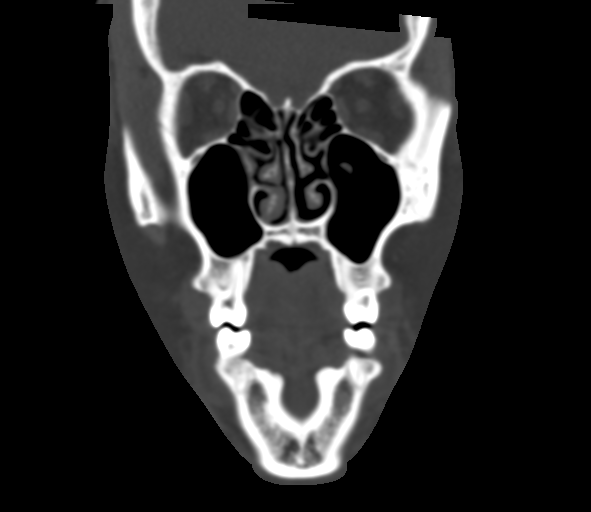
[im 44/80  bone]
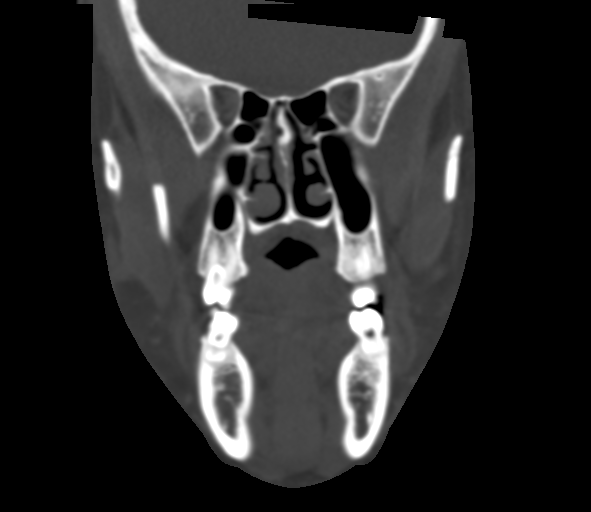

[Series 10: facialbone 2.0 sag st · sagittal · 0.32mm/px · 3 of 84 slices shown]
[im 31/84  bone]
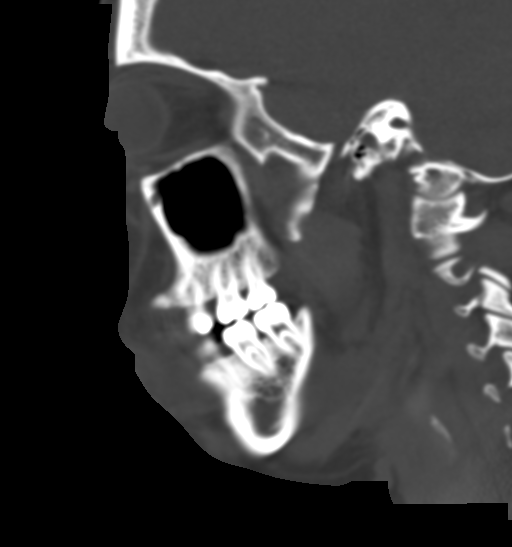
[im 42/84  bone]
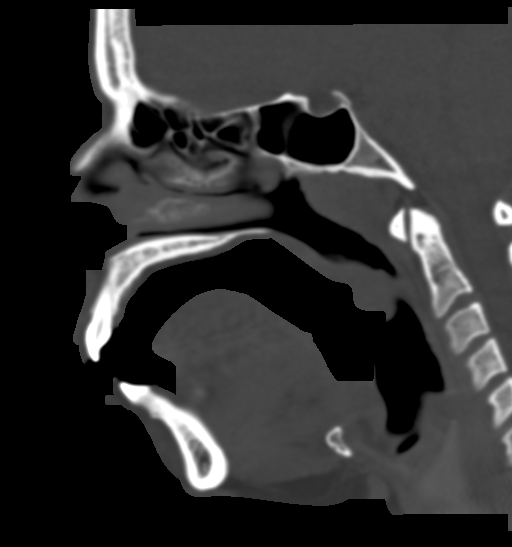
[im 53/84  bone]
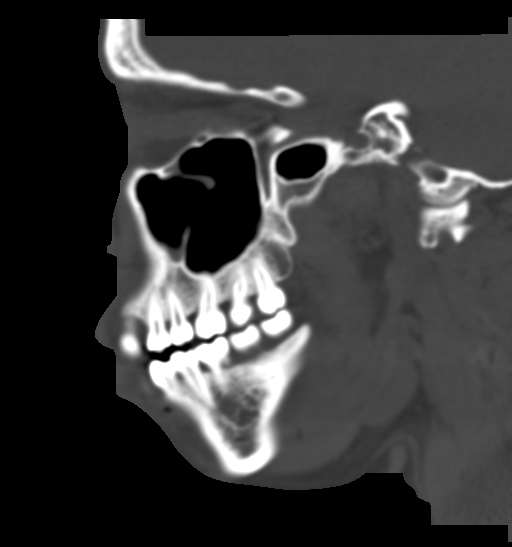

[16 of 47 positions shown; findings below may reference images not displayed]

FINDINGS: CT HEAD FINDINGS

Brain: No evidence of acute infarction, hemorrhage, hydrocephalus,
extra-axial collection or mass lesion/mass effect.

Vascular: No hyperdense vessel or unexpected calcification.

Skull: Normal. Negative for fracture or focal lesion.

Other: No scalp lesion or hematoma.

CT MAXILLOFACIAL FINDINGS

Osseous: The bony structures are intact. No facial bone fractures.
The mandibular condyles are normally located.

Orbits: Negative. No traumatic or inflammatory finding.

Sinuses: Clear.

Soft tissues: Negative.
IMPRESSION: No acute intracranial findings or skull fracture.

No facial bone fractures.

## 2019-09-06 DIAGNOSIS — Z87891 Personal history of nicotine dependence: Secondary | ICD-10-CM | POA: Diagnosis not present

## 2019-09-06 DIAGNOSIS — R197 Diarrhea, unspecified: Secondary | ICD-10-CM | POA: Diagnosis not present

## 2019-09-06 DIAGNOSIS — I499 Cardiac arrhythmia, unspecified: Secondary | ICD-10-CM | POA: Diagnosis not present

## 2019-09-06 DIAGNOSIS — R1013 Epigastric pain: Secondary | ICD-10-CM | POA: Diagnosis not present

## 2019-09-06 DIAGNOSIS — D72829 Elevated white blood cell count, unspecified: Secondary | ICD-10-CM | POA: Diagnosis not present

## 2019-09-06 DIAGNOSIS — R112 Nausea with vomiting, unspecified: Secondary | ICD-10-CM | POA: Diagnosis not present

## 2019-09-07 DIAGNOSIS — Z8719 Personal history of other diseases of the digestive system: Secondary | ICD-10-CM | POA: Diagnosis not present

## 2019-09-07 DIAGNOSIS — R197 Diarrhea, unspecified: Secondary | ICD-10-CM | POA: Diagnosis not present

## 2019-09-07 DIAGNOSIS — R1084 Generalized abdominal pain: Secondary | ICD-10-CM | POA: Diagnosis not present

## 2019-09-07 DIAGNOSIS — R112 Nausea with vomiting, unspecified: Secondary | ICD-10-CM | POA: Diagnosis not present

## 2019-09-07 DIAGNOSIS — R10817 Generalized abdominal tenderness: Secondary | ICD-10-CM | POA: Diagnosis not present

## 2019-09-07 DIAGNOSIS — Z87891 Personal history of nicotine dependence: Secondary | ICD-10-CM | POA: Diagnosis not present

## 2019-09-09 DIAGNOSIS — R109 Unspecified abdominal pain: Secondary | ICD-10-CM | POA: Diagnosis not present

## 2019-09-09 DIAGNOSIS — Z20822 Contact with and (suspected) exposure to covid-19: Secondary | ICD-10-CM | POA: Diagnosis not present

## 2019-09-09 DIAGNOSIS — F1299 Cannabis use, unspecified with unspecified cannabis-induced disorder: Secondary | ICD-10-CM | POA: Diagnosis not present

## 2019-09-09 DIAGNOSIS — Z87891 Personal history of nicotine dependence: Secondary | ICD-10-CM | POA: Diagnosis not present

## 2019-09-09 DIAGNOSIS — Z888 Allergy status to other drugs, medicaments and biological substances status: Secondary | ICD-10-CM | POA: Diagnosis not present

## 2019-09-09 DIAGNOSIS — R112 Nausea with vomiting, unspecified: Secondary | ICD-10-CM | POA: Diagnosis not present

## 2019-09-09 DIAGNOSIS — K2100 Gastro-esophageal reflux disease with esophagitis, without bleeding: Secondary | ICD-10-CM | POA: Diagnosis not present

## 2019-09-09 DIAGNOSIS — E876 Hypokalemia: Secondary | ICD-10-CM | POA: Diagnosis not present

## 2019-09-10 DIAGNOSIS — R112 Nausea with vomiting, unspecified: Secondary | ICD-10-CM | POA: Diagnosis not present

## 2019-09-10 MED ORDER — GENERIC EXTERNAL MEDICATION
Status: DC
Start: ? — End: 2019-09-10

## 2019-09-10 MED ORDER — PANTOPRAZOLE SODIUM 40 MG IV SOLR
40.00 | INTRAVENOUS | Status: DC
Start: 2019-09-10 — End: 2019-09-10

## 2019-09-10 MED ORDER — MELATONIN 3 MG PO TABS
6.00 | ORAL_TABLET | ORAL | Status: DC
Start: ? — End: 2019-09-10

## 2019-09-10 MED ORDER — ACETAMINOPHEN 325 MG PO TABS
650.00 | ORAL_TABLET | ORAL | Status: DC
Start: ? — End: 2019-09-10

## 2020-01-15 DIAGNOSIS — R112 Nausea with vomiting, unspecified: Secondary | ICD-10-CM | POA: Diagnosis not present

## 2020-01-15 DIAGNOSIS — R1032 Left lower quadrant pain: Secondary | ICD-10-CM | POA: Diagnosis not present

## 2020-01-15 DIAGNOSIS — R61 Generalized hyperhidrosis: Secondary | ICD-10-CM | POA: Diagnosis not present

## 2020-01-17 DIAGNOSIS — R112 Nausea with vomiting, unspecified: Secondary | ICD-10-CM | POA: Diagnosis not present

## 2020-01-17 DIAGNOSIS — R5383 Other fatigue: Secondary | ICD-10-CM | POA: Diagnosis not present

## 2020-01-17 DIAGNOSIS — R109 Unspecified abdominal pain: Secondary | ICD-10-CM | POA: Diagnosis not present

## 2020-01-17 DIAGNOSIS — I4581 Long QT syndrome: Secondary | ICD-10-CM | POA: Diagnosis not present

## 2020-01-17 DIAGNOSIS — R251 Tremor, unspecified: Secondary | ICD-10-CM | POA: Diagnosis not present

## 2020-01-17 DIAGNOSIS — D72829 Elevated white blood cell count, unspecified: Secondary | ICD-10-CM | POA: Diagnosis not present

## 2020-01-17 DIAGNOSIS — Z20822 Contact with and (suspected) exposure to covid-19: Secondary | ICD-10-CM | POA: Diagnosis not present

## 2020-01-17 DIAGNOSIS — R197 Diarrhea, unspecified: Secondary | ICD-10-CM | POA: Diagnosis not present

## 2020-01-17 DIAGNOSIS — K2100 Gastro-esophageal reflux disease with esophagitis, without bleeding: Secondary | ICD-10-CM | POA: Diagnosis not present

## 2020-01-17 DIAGNOSIS — G969 Disorder of central nervous system, unspecified: Secondary | ICD-10-CM | POA: Diagnosis not present

## 2020-01-17 DIAGNOSIS — R1032 Left lower quadrant pain: Secondary | ICD-10-CM | POA: Diagnosis not present

## 2020-01-18 DIAGNOSIS — R197 Diarrhea, unspecified: Secondary | ICD-10-CM | POA: Diagnosis not present

## 2020-01-18 DIAGNOSIS — R109 Unspecified abdominal pain: Secondary | ICD-10-CM | POA: Diagnosis not present

## 2020-01-18 DIAGNOSIS — R112 Nausea with vomiting, unspecified: Secondary | ICD-10-CM | POA: Diagnosis not present

## 2020-01-18 DIAGNOSIS — R251 Tremor, unspecified: Secondary | ICD-10-CM | POA: Diagnosis not present

## 2020-02-27 DIAGNOSIS — R9431 Abnormal electrocardiogram [ECG] [EKG]: Secondary | ICD-10-CM | POA: Diagnosis not present

## 2020-02-27 DIAGNOSIS — Z1159 Encounter for screening for other viral diseases: Secondary | ICD-10-CM | POA: Diagnosis not present

## 2020-02-27 DIAGNOSIS — Z23 Encounter for immunization: Secondary | ICD-10-CM | POA: Diagnosis not present

## 2020-02-27 DIAGNOSIS — R404 Transient alteration of awareness: Secondary | ICD-10-CM | POA: Diagnosis not present

## 2020-02-27 DIAGNOSIS — Z720 Tobacco use: Secondary | ICD-10-CM | POA: Diagnosis not present

## 2020-02-27 DIAGNOSIS — K2100 Gastro-esophageal reflux disease with esophagitis, without bleeding: Secondary | ICD-10-CM | POA: Diagnosis not present

## 2020-02-27 DIAGNOSIS — R112 Nausea with vomiting, unspecified: Secondary | ICD-10-CM | POA: Diagnosis not present

## 2020-02-27 DIAGNOSIS — F4321 Adjustment disorder with depressed mood: Secondary | ICD-10-CM | POA: Diagnosis not present

## 2020-02-27 DIAGNOSIS — E876 Hypokalemia: Secondary | ICD-10-CM | POA: Diagnosis not present

## 2020-02-27 DIAGNOSIS — D72829 Elevated white blood cell count, unspecified: Secondary | ICD-10-CM | POA: Diagnosis not present

## 2020-03-03 ENCOUNTER — Ambulatory Visit: Payer: Medicaid Other | Admitting: Family Medicine

## 2020-03-11 DIAGNOSIS — R112 Nausea with vomiting, unspecified: Secondary | ICD-10-CM | POA: Diagnosis not present

## 2020-03-11 DIAGNOSIS — R1030 Lower abdominal pain, unspecified: Secondary | ICD-10-CM | POA: Diagnosis not present

## 2020-03-11 DIAGNOSIS — K2100 Gastro-esophageal reflux disease with esophagitis, without bleeding: Secondary | ICD-10-CM | POA: Diagnosis not present

## 2020-04-14 DIAGNOSIS — R87810 Cervical high risk human papillomavirus (HPV) DNA test positive: Secondary | ICD-10-CM | POA: Diagnosis not present

## 2020-04-14 DIAGNOSIS — Z13220 Encounter for screening for lipoid disorders: Secondary | ICD-10-CM | POA: Diagnosis not present

## 2020-04-14 DIAGNOSIS — Z124 Encounter for screening for malignant neoplasm of cervix: Secondary | ICD-10-CM | POA: Diagnosis not present

## 2020-04-14 DIAGNOSIS — Z0000 Encounter for general adult medical examination without abnormal findings: Secondary | ICD-10-CM | POA: Diagnosis not present

## 2020-04-14 DIAGNOSIS — Z720 Tobacco use: Secondary | ICD-10-CM | POA: Diagnosis not present

## 2020-04-14 DIAGNOSIS — L509 Urticaria, unspecified: Secondary | ICD-10-CM | POA: Diagnosis not present

## 2020-04-14 DIAGNOSIS — R87610 Atypical squamous cells of undetermined significance on cytologic smear of cervix (ASC-US): Secondary | ICD-10-CM | POA: Diagnosis not present

## 2020-04-14 DIAGNOSIS — Z131 Encounter for screening for diabetes mellitus: Secondary | ICD-10-CM | POA: Diagnosis not present

## 2020-04-22 DIAGNOSIS — R87618 Other abnormal cytological findings on specimens from cervix uteri: Secondary | ICD-10-CM | POA: Diagnosis not present

## 2020-04-22 DIAGNOSIS — R87610 Atypical squamous cells of undetermined significance on cytologic smear of cervix (ASC-US): Secondary | ICD-10-CM | POA: Diagnosis not present

## 2020-04-22 DIAGNOSIS — N879 Dysplasia of cervix uteri, unspecified: Secondary | ICD-10-CM | POA: Diagnosis not present

## 2020-04-24 ENCOUNTER — Other Ambulatory Visit: Payer: Self-pay | Admitting: Obstetrics and Gynecology

## 2020-04-24 NOTE — Patient Instructions (Signed)
Hi [name], sorry we missed you today  - as a part of your Medicaid benefit, you are eligible for care management and care coordination services at no cost or copay. I was unable to reach you by phone today but would be happy to help you with your health related needs. Please feel free to call me at [phone].  A member of the Managed Medicaid care management team will reach out to you again over the next 7 days.   Kathi Der RN, BSN Christine Shields  [employer] - Managed Medicaid High Risk [phone].

## 2020-04-24 NOTE — Patient Outreach (Signed)
Care Coordination  04/24/2020  Christine Shields 06-Nov-1990 [id number]    Medicaid Managed Care   Unsuccessful Outreach Note  04/24/2020 Name: Christine Shields MRN: [id number] DOB: 1990-10-12  Referred by: Patient, No Pcp Per Reason for referral : High Risk Managed Medicaid (Unsuccessful telephone outreach)   An unsuccessful telephone outreach was attempted today. The patient was referred to the case management team for assistance with care management and care coordination.   Follow Up Plan: The Managed Medicaid care management team will reach out to the patient again over the next 7 days.   Kathi Der RN, BSN [place]  [employer] - Managed Medicaid High Risk [phone].

## 2020-04-30 DIAGNOSIS — R55 Syncope and collapse: Secondary | ICD-10-CM | POA: Diagnosis not present

## 2020-05-11 DIAGNOSIS — D122 Benign neoplasm of ascending colon: Secondary | ICD-10-CM | POA: Diagnosis not present

## 2020-05-11 DIAGNOSIS — D120 Benign neoplasm of cecum: Secondary | ICD-10-CM | POA: Diagnosis not present

## 2020-05-11 DIAGNOSIS — K209 Esophagitis, unspecified without bleeding: Secondary | ICD-10-CM | POA: Diagnosis not present

## 2020-05-11 DIAGNOSIS — K635 Polyp of colon: Secondary | ICD-10-CM | POA: Diagnosis not present

## 2020-05-11 DIAGNOSIS — K449 Diaphragmatic hernia without obstruction or gangrene: Secondary | ICD-10-CM | POA: Diagnosis not present

## 2020-05-11 DIAGNOSIS — K222 Esophageal obstruction: Secondary | ICD-10-CM | POA: Diagnosis not present

## 2020-05-11 DIAGNOSIS — K3189 Other diseases of stomach and duodenum: Secondary | ICD-10-CM | POA: Diagnosis not present

## 2020-05-11 DIAGNOSIS — K5730 Diverticulosis of large intestine without perforation or abscess without bleeding: Secondary | ICD-10-CM | POA: Diagnosis not present

## 2020-05-11 DIAGNOSIS — R1030 Lower abdominal pain, unspecified: Secondary | ICD-10-CM | POA: Diagnosis not present

## 2020-05-11 DIAGNOSIS — K219 Gastro-esophageal reflux disease without esophagitis: Secondary | ICD-10-CM | POA: Diagnosis not present

## 2020-05-18 DIAGNOSIS — R112 Nausea with vomiting, unspecified: Secondary | ICD-10-CM | POA: Diagnosis not present

## 2020-05-18 DIAGNOSIS — R1084 Generalized abdominal pain: Secondary | ICD-10-CM | POA: Diagnosis not present

## 2020-06-09 DIAGNOSIS — K2100 Gastro-esophageal reflux disease with esophagitis, without bleeding: Secondary | ICD-10-CM | POA: Diagnosis not present

## 2020-07-17 DIAGNOSIS — R1013 Epigastric pain: Secondary | ICD-10-CM | POA: Diagnosis not present

## 2020-07-17 DIAGNOSIS — R110 Nausea: Secondary | ICD-10-CM | POA: Diagnosis not present

## 2020-07-17 DIAGNOSIS — R1115 Cyclical vomiting syndrome unrelated to migraine: Secondary | ICD-10-CM | POA: Diagnosis not present

## 2020-07-20 ENCOUNTER — Telehealth: Payer: Self-pay

## 2020-07-20 NOTE — Telephone Encounter (Signed)
Transition Care Management Unsuccessful Follow-up Telephone Call  Date of discharge and from where:  07/17/2020 from [facility]  Attempts:  1st Attempt  Reason for unsuccessful TCM follow-up call:  Unable to leave message

## 2020-07-21 NOTE — Telephone Encounter (Signed)
Transition Care Management Unsuccessful Follow-up Telephone Call  Date of discharge and from where:  07/17/2020 from [facility]  Attempts:  2nd Attempt  Reason for unsuccessful TCM follow-up call:  Unable to leave message

## 2020-07-22 NOTE — Telephone Encounter (Signed)
Transition Care Management Unsuccessful Follow-up Telephone Call  Date of discharge and from where:  07/17/2020 from [facility]  Attempts:  3rd Attempt  Reason for unsuccessful TCM follow-up call:  Unable to reach patient

## 2020-10-14 DIAGNOSIS — U071 COVID-19: Secondary | ICD-10-CM | POA: Diagnosis not present

## 2020-10-14 DIAGNOSIS — R059 Cough, unspecified: Secondary | ICD-10-CM | POA: Diagnosis not present

## 2020-10-14 DIAGNOSIS — M791 Myalgia, unspecified site: Secondary | ICD-10-CM | POA: Diagnosis not present

## 2020-10-14 DIAGNOSIS — R519 Headache, unspecified: Secondary | ICD-10-CM | POA: Diagnosis not present

## 2021-04-15 DIAGNOSIS — R110 Nausea: Secondary | ICD-10-CM | POA: Diagnosis not present

## 2021-04-15 DIAGNOSIS — M25562 Pain in left knee: Secondary | ICD-10-CM | POA: Diagnosis not present

## 2021-04-29 DIAGNOSIS — R451 Restlessness and agitation: Secondary | ICD-10-CM | POA: Diagnosis not present

## 2021-04-29 DIAGNOSIS — R110 Nausea: Secondary | ICD-10-CM | POA: Diagnosis not present

## 2021-04-29 DIAGNOSIS — R109 Unspecified abdominal pain: Secondary | ICD-10-CM | POA: Diagnosis not present

## 2021-06-10 DIAGNOSIS — R109 Unspecified abdominal pain: Secondary | ICD-10-CM | POA: Diagnosis not present

## 2021-06-10 DIAGNOSIS — R451 Restlessness and agitation: Secondary | ICD-10-CM | POA: Diagnosis not present

## 2021-08-05 DIAGNOSIS — R1114 Bilious vomiting: Secondary | ICD-10-CM | POA: Diagnosis not present

## 2021-08-05 DIAGNOSIS — R1013 Epigastric pain: Secondary | ICD-10-CM | POA: Diagnosis not present

## 2021-08-05 DIAGNOSIS — R197 Diarrhea, unspecified: Secondary | ICD-10-CM | POA: Diagnosis not present

## 2021-08-05 DIAGNOSIS — R110 Nausea: Secondary | ICD-10-CM | POA: Diagnosis not present

## 2021-08-05 DIAGNOSIS — R630 Anorexia: Secondary | ICD-10-CM | POA: Diagnosis not present

## 2021-08-17 DIAGNOSIS — R87612 Low grade squamous intraepithelial lesion on cytologic smear of cervix (LGSIL): Secondary | ICD-10-CM | POA: Diagnosis not present

## 2021-08-17 DIAGNOSIS — D126 Benign neoplasm of colon, unspecified: Secondary | ICD-10-CM | POA: Diagnosis not present

## 2021-08-17 DIAGNOSIS — Z124 Encounter for screening for malignant neoplasm of cervix: Secondary | ICD-10-CM | POA: Diagnosis not present

## 2021-08-17 DIAGNOSIS — Z23 Encounter for immunization: Secondary | ICD-10-CM | POA: Diagnosis not present

## 2021-08-17 DIAGNOSIS — Z0000 Encounter for general adult medical examination without abnormal findings: Secondary | ICD-10-CM | POA: Diagnosis not present

## 2021-08-17 DIAGNOSIS — Z13220 Encounter for screening for lipoid disorders: Secondary | ICD-10-CM | POA: Diagnosis not present

## 2021-08-17 DIAGNOSIS — R110 Nausea: Secondary | ICD-10-CM | POA: Diagnosis not present

## 2021-08-17 DIAGNOSIS — Z8742 Personal history of other diseases of the female genital tract: Secondary | ICD-10-CM | POA: Diagnosis not present

## 2021-08-17 DIAGNOSIS — Z131 Encounter for screening for diabetes mellitus: Secondary | ICD-10-CM | POA: Diagnosis not present

## 2021-08-17 DIAGNOSIS — Z113 Encounter for screening for infections with a predominantly sexual mode of transmission: Secondary | ICD-10-CM | POA: Diagnosis not present

## 2021-08-17 DIAGNOSIS — Z720 Tobacco use: Secondary | ICD-10-CM | POA: Diagnosis not present

## 2021-08-17 DIAGNOSIS — R112 Nausea with vomiting, unspecified: Secondary | ICD-10-CM | POA: Diagnosis not present

## 2022-01-09 DIAGNOSIS — R059 Cough, unspecified: Secondary | ICD-10-CM | POA: Diagnosis not present

## 2022-01-09 DIAGNOSIS — R0981 Nasal congestion: Secondary | ICD-10-CM | POA: Diagnosis not present

## 2022-03-06 DIAGNOSIS — F12188 Cannabis abuse with other cannabis-induced disorder: Secondary | ICD-10-CM | POA: Diagnosis not present

## 2022-03-06 DIAGNOSIS — R112 Nausea with vomiting, unspecified: Secondary | ICD-10-CM | POA: Diagnosis not present

## 2022-03-06 DIAGNOSIS — Z79899 Other long term (current) drug therapy: Secondary | ICD-10-CM | POA: Diagnosis not present

## 2022-03-07 DIAGNOSIS — F12188 Cannabis abuse with other cannabis-induced disorder: Secondary | ICD-10-CM | POA: Diagnosis not present

## 2022-03-07 DIAGNOSIS — R112 Nausea with vomiting, unspecified: Secondary | ICD-10-CM | POA: Diagnosis not present

## 2022-04-26 DIAGNOSIS — F410 Panic disorder [episodic paroxysmal anxiety] without agoraphobia: Secondary | ICD-10-CM | POA: Diagnosis not present

## 2022-04-26 DIAGNOSIS — R1110 Vomiting, unspecified: Secondary | ICD-10-CM | POA: Diagnosis not present
